# Patient Record
Sex: Female | Born: 1980 | Hispanic: Yes | Marital: Single | Smoking: Never smoker
Health system: Southern US, Community
[De-identification: ages and names within clinical notes are randomized; demographics above are authoritative.]

## PROBLEM LIST (undated history)

## (undated) ENCOUNTER — Inpatient Hospital Stay (HOSPITAL_COMMUNITY): Payer: Self-pay

## (undated) DIAGNOSIS — G43909 Migraine, unspecified, not intractable, without status migrainosus: Secondary | ICD-10-CM

## (undated) DIAGNOSIS — Z8759 Personal history of other complications of pregnancy, childbirth and the puerperium: Secondary | ICD-10-CM

## (undated) DIAGNOSIS — F329 Major depressive disorder, single episode, unspecified: Secondary | ICD-10-CM

## (undated) DIAGNOSIS — F419 Anxiety disorder, unspecified: Secondary | ICD-10-CM

## (undated) DIAGNOSIS — O24419 Gestational diabetes mellitus in pregnancy, unspecified control: Secondary | ICD-10-CM

## (undated) DIAGNOSIS — I10 Essential (primary) hypertension: Secondary | ICD-10-CM

## (undated) DIAGNOSIS — G40909 Epilepsy, unspecified, not intractable, without status epilepticus: Secondary | ICD-10-CM

## (undated) DIAGNOSIS — O139 Gestational [pregnancy-induced] hypertension without significant proteinuria, unspecified trimester: Secondary | ICD-10-CM

## (undated) DIAGNOSIS — O99350 Diseases of the nervous system complicating pregnancy, unspecified trimester: Secondary | ICD-10-CM

## (undated) DIAGNOSIS — S92902A Unspecified fracture of left foot, initial encounter for closed fracture: Secondary | ICD-10-CM

## (undated) DIAGNOSIS — F32A Depression, unspecified: Secondary | ICD-10-CM

## (undated) HISTORY — DX: Diseases of the nervous system complicating pregnancy, unspecified trimester: O99.350

## (undated) HISTORY — DX: Epilepsy, unspecified, not intractable, without status epilepticus: G40.909

## (undated) HISTORY — DX: Personal history of other complications of pregnancy, childbirth and the puerperium: Z87.59

## (undated) HISTORY — DX: Migraine, unspecified, not intractable, without status migrainosus: G43.909

## (undated) HISTORY — PX: TUBAL LIGATION: SHX77

## (undated) HISTORY — PX: NO PAST SURGERIES: SHX2092

---

## 2010-01-05 DIAGNOSIS — G40909 Epilepsy, unspecified, not intractable, without status epilepticus: Secondary | ICD-10-CM

## 2010-01-05 HISTORY — DX: Epilepsy, unspecified, not intractable, without status epilepticus: G40.909

## 2010-11-10 DIAGNOSIS — O24419 Gestational diabetes mellitus in pregnancy, unspecified control: Secondary | ICD-10-CM

## 2014-05-14 DIAGNOSIS — O139 Gestational [pregnancy-induced] hypertension without significant proteinuria, unspecified trimester: Secondary | ICD-10-CM

## 2014-05-14 DIAGNOSIS — O24419 Gestational diabetes mellitus in pregnancy, unspecified control: Secondary | ICD-10-CM

## 2015-12-12 LAB — OB RESULTS CONSOLE VARICELLA ZOSTER ANTIBODY, IGG: Varicella: NON-IMMUNE/NOT IMMUNE

## 2015-12-12 LAB — CULTURE, OB URINE: Urine Culture, OB: NO GROWTH

## 2015-12-12 LAB — OB RESULTS CONSOLE ABO/RH: RH TYPE: POSITIVE

## 2015-12-12 LAB — OB RESULTS CONSOLE HIV ANTIBODY (ROUTINE TESTING): HIV: NONREACTIVE

## 2015-12-12 LAB — GLUCOSE TOLERANCE, 1 HOUR (50G) W/O FASTING: GLUCOSE 1 HOUR: 87

## 2015-12-12 LAB — OB RESULTS CONSOLE HEPATITIS B SURFACE ANTIGEN: Hepatitis B Surface Ag: NEGATIVE

## 2015-12-12 LAB — OB RESULTS CONSOLE RPR: RPR: NONREACTIVE

## 2015-12-12 LAB — CYSTIC FIBROSIS DIAGNOSTIC STUDY: Interpretation-CFDNA:: NEGATIVE

## 2015-12-12 LAB — OB RESULTS CONSOLE GC/CHLAMYDIA
Chlamydia: NEGATIVE
Gonorrhea: NEGATIVE

## 2015-12-12 LAB — OB RESULTS CONSOLE HGB/HCT, BLOOD
HEMATOCRIT: 43 %
Hemoglobin: 14.2 g/dL

## 2015-12-12 LAB — OB RESULTS CONSOLE ANTIBODY SCREEN: Antibody Screen: NEGATIVE

## 2015-12-12 LAB — CYTOLOGY - PAP: Pap: NEGATIVE

## 2015-12-12 LAB — OB RESULTS CONSOLE PLATELET COUNT: Platelets: 323 10*3/uL

## 2015-12-12 LAB — OB RESULTS CONSOLE RUBELLA ANTIBODY, IGM: RUBELLA: IMMUNE

## 2015-12-27 ENCOUNTER — Other Ambulatory Visit (HOSPITAL_COMMUNITY): Payer: Self-pay | Admitting: Nurse Practitioner

## 2015-12-27 DIAGNOSIS — Z3682 Encounter for antenatal screening for nuchal translucency: Secondary | ICD-10-CM

## 2015-12-27 DIAGNOSIS — O09521 Supervision of elderly multigravida, first trimester: Secondary | ICD-10-CM

## 2015-12-27 DIAGNOSIS — Z3A13 13 weeks gestation of pregnancy: Secondary | ICD-10-CM

## 2015-12-31 ENCOUNTER — Encounter (HOSPITAL_COMMUNITY): Payer: Self-pay | Admitting: *Deleted

## 2016-01-01 ENCOUNTER — Encounter (HOSPITAL_COMMUNITY): Payer: Self-pay

## 2016-01-01 ENCOUNTER — Other Ambulatory Visit (HOSPITAL_COMMUNITY): Payer: Self-pay | Admitting: Nurse Practitioner

## 2016-01-01 ENCOUNTER — Ambulatory Visit (HOSPITAL_COMMUNITY)
Admission: RE | Admit: 2016-01-01 | Discharge: 2016-01-01 | Disposition: A | Discharge status: Home / Self Care | Payer: Self-pay | Source: Ambulatory Visit | Attending: Nurse Practitioner | Admitting: Nurse Practitioner

## 2016-01-01 DIAGNOSIS — O09522 Supervision of elderly multigravida, second trimester: Secondary | ICD-10-CM | POA: Insufficient documentation

## 2016-01-01 DIAGNOSIS — O09521 Supervision of elderly multigravida, first trimester: Secondary | ICD-10-CM

## 2016-01-01 DIAGNOSIS — O09511 Supervision of elderly primigravida, first trimester: Secondary | ICD-10-CM | POA: Insufficient documentation

## 2016-01-01 DIAGNOSIS — Z3A13 13 weeks gestation of pregnancy: Secondary | ICD-10-CM | POA: Insufficient documentation

## 2016-01-01 DIAGNOSIS — Z3682 Encounter for antenatal screening for nuchal translucency: Secondary | ICD-10-CM

## 2016-01-01 DIAGNOSIS — Z3A14 14 weeks gestation of pregnancy: Secondary | ICD-10-CM | POA: Insufficient documentation

## 2016-01-01 HISTORY — DX: Essential (primary) hypertension: I10

## 2016-01-01 HISTORY — DX: Gestational diabetes mellitus in pregnancy, unspecified control: O24.419

## 2016-01-01 NOTE — Progress Notes (Signed)
Appointment Date: 01/01/2016 DOB: 07/23/1980 Referring Provider: Felipe Hull, Margareta, NP Attending: Alpha GulaPaul Whitecar, MD  [name] Hull Hull was seen for genetic counseling because of a maternal age of 35 y.o..  [name] Hull Hull from [facility] Interpreter services provided Spanish/English translation.    In summary:  Discussed AMA and associated risk for fetal aneuploidy  Discussed options for screening  Quad screen - would like this drawn at appropriate gestation  NIPS - would consider if Quad and/or ultrasound was abnormal  Ultrasound - scheduled for 4 weeks  Discussed diagnostic testing options  Amniocentesis - declined  Reviewed family history concerns  Niece with hearing loss  Niece with congenital heart defect  Discussed carrier screening options - declined  CF  SMA  Hemoglobinopathies  She was counseled regarding maternal age and the association with risk for chromosome conditions due to nondisjunction with aging of the ova.   We reviewed chromosomes, nondisjunction, and the associated 1 in 2288 risk for fetal aneuploidy related to a maternal age of 35 y.o. at 9359w0d gestation.  She was counseled that the risk for aneuploidy decreases as gestational age increases, accounting for those pregnancies which spontaneously abort.  We specifically discussed Down syndrome (trisomy 4221), trisomies 913 and 3318, and sex chromosome aneuploidies (47,XXX and 47,XXY) including the common features and prognoses of each.   We reviewed available screening options including Quad screen, noninvasive prenatal screening (NIPS)/cell free DNA (cfDNA) screening, and detailed ultrasound.  She was counseled that screening tests are used to modify a patient's a priori risk for aneuploidy, typically based on age. This estimate provides a pregnancy specific risk assessment. We reviewed the benefits and limitations of each option. Specifically, we discussed the conditions for which each test screens, the  detection rates, and false positive rates of each. She was also counseled regarding diagnostic testing via amniocentesis. We reviewed the approximate 1 in 300-500 risk for complications from amniocentesis, including spontaneous pregnancy loss. We discussed the possible results that the tests might provide including: positive, negative, unanticipated, and no result. Finally, she was counseled regarding the cost of each option and potential out of pocket expenses.  After consideration of all the options, she elected to proceed with the Quad screen at the appropriate gestation.    She was too far along for a nuchal translucency to be measured by ultrasound today.  The report will be documented separately.  The patient would like to return for a detailed ultrasound at approximately [redacted] weeks gestation.  This appointment was scheduled today. She understands that screening tests cannot rule out all birth defects or genetic syndromes.   [name] Hull Hull was provided with written information regarding cystic fibrosis (CF), spinal muscular atrophy (SMA) and hemoglobinopathies including the carrier frequency, availability of carrier screening and prenatal diagnosis if indicated.  In addition, we discussed that CF and hemoglobinopathies are routinely screened for as part of the [place] newborn screening panel.  After further discussion, she declined screening for CF, SMA and hemoglobinopathies.  Both family histories were reviewed.  [name] reported that her sister has twin girls, one of whom has hearing loss.  She has no other health or learning concerns.  We discussed that hearing loss can have many different etiologies, some of which are genetic and others which are not.  Without knowing the etiology of her nieces hearing loss, a specific recurrence risk can not be determined for [name] Hull Hull.  The daughter of her brother was born with an isolated heart birth defect that did  not require surgery.  We  discussed that this is likely too far removed to increase the chance for a heart birth defect in her child above the general population chance.  The remainder of the family histories were reviewed and found to be noncontributory for birth defects, intellectual disability, and known genetic conditions. Without further information regarding the provided family history, an accurate genetic risk cannot be calculated. Further genetic counseling is warranted if more information is obtained.  [name] Hull Hull denied exposure to environmental toxins or chemical agents. She denied the use of alcohol, tobacco or street drugs. She denied significant viral illnesses during the course of her pregnancy. Her medical and surgical histories were noncontributory.   I counseled [name] Hull Hull regarding the above risks and available options.  The approximate face-to-face time with the genetic counselor was 45 minutes.  [name] Hull Hull, [name] Hull,  Certified Genetic Counselor

## 2016-01-02 ENCOUNTER — Other Ambulatory Visit (HOSPITAL_COMMUNITY): Payer: Self-pay | Admitting: *Deleted

## 2016-01-02 DIAGNOSIS — O09522 Supervision of elderly multigravida, second trimester: Secondary | ICD-10-CM

## 2016-01-30 ENCOUNTER — Ambulatory Visit (HOSPITAL_COMMUNITY)
Admission: RE | Admit: 2016-01-30 | Discharge: 2016-01-30 | Disposition: A | Discharge status: Home / Self Care | Payer: Self-pay | Source: Ambulatory Visit | Attending: Obstetrics & Gynecology | Admitting: Obstetrics & Gynecology

## 2016-01-30 ENCOUNTER — Encounter (HOSPITAL_COMMUNITY): Payer: Self-pay

## 2016-01-30 ENCOUNTER — Ambulatory Visit (HOSPITAL_COMMUNITY): Payer: Self-pay

## 2016-01-30 ENCOUNTER — Other Ambulatory Visit (HOSPITAL_COMMUNITY): Payer: Self-pay | Admitting: Maternal and Fetal Medicine

## 2016-01-30 DIAGNOSIS — O281 Abnormal biochemical finding on antenatal screening of mother: Secondary | ICD-10-CM

## 2016-01-30 DIAGNOSIS — O09522 Supervision of elderly multigravida, second trimester: Secondary | ICD-10-CM

## 2016-01-30 DIAGNOSIS — Z8632 Personal history of gestational diabetes: Secondary | ICD-10-CM

## 2016-01-30 DIAGNOSIS — Z3A18 18 weeks gestation of pregnancy: Secondary | ICD-10-CM

## 2016-01-30 DIAGNOSIS — O280 Abnormal hematological finding on antenatal screening of mother: Secondary | ICD-10-CM | POA: Insufficient documentation

## 2016-01-30 DIAGNOSIS — O09299 Supervision of pregnancy with other poor reproductive or obstetric history, unspecified trimester: Secondary | ICD-10-CM

## 2016-01-30 NOTE — Progress Notes (Signed)
Genetic Counseling  High-Risk Gestation Note  Appointment Date:  01/30/2016 Referred By: Tereso NewcomerAnyanwu, Ugonna A, MD Date of Birth:  02/22/1980   Pregnancy History: [id number] Estimated Date of Delivery: 07/02/16 Estimated Gestational Age: 5765w0d Attending: Particia NearingMartha Decker, MD    Ms. [name] SickleDiana Zavala Hull was seen for genetic counseling regarding a maternal age of 36 y.o. and an increased risk for Down syndrome based on Quad screen [facility]. [name] Hull was previously seen for genetic counseling in our office regarding advanced maternal age on 01/01/16. [employer] Spanish/English interpreter, Marchelle FolksAmanda, provided interpretation for today's visit.    In summary:  Discussed AMA and associated risk for fetal aneuploidy  Reviewed results of screening  Down syndrome risk increased from age risk (1 in 70203) to 1 in 21184   Trisomy 18 risk decreased from age risk  Discussed options for additional screening  NIPS- elected to pursue Panorama today   Ultrasound- performed today, within normal limits  Discussed diagnostic testing options  Amniocentesis- declined   We reviewed [name] Hull's maternal serum Quad screen result and the associated increase in risk for fetal Down syndrome (1 in 203 to 1 in 184).  She was counseled regarding other explanations for a screen positive result including normal variation and differences in maternal metabolism.  In addition, we reviewed the screen adjusted reduction in risks for trisomy 18 and ONTDs.  She understands that Quad screening provides a pregnancy specific risk for Down syndrome, but is not considered to be diagnostic.    We reviewed other available screening options including noninvasive prenatal screening (NIPS)/prenatal cell free DNA testing and detailed ultrasound.  We reviewed the methodology of NIPS, the conditions for which it assesses, the detection rates, and false positive rates. She understands  that it is highly sensitive and specific but is not diagnostic.  In addition, we discussed that ~50-80% of fetuses with Down syndrome and up to 90-95% of fetuses with trisomy 18/13, when well visualized, have detectable anomalies or soft markers by detailed ultrasound (~18+ weeks gestation).   Detailed ultrasound was performed today. Visualized fetal anatomy was within normal range. Complete ultrasound results reported under separate cover.   We reviewed the diagnostic testing option via amniocentesis.  We reviewed the approximate 1 in 300-500 risk for complications, including spontaneous pregnancy loss.  We discussed the possible results that the tests might provide including: positive, negative, unanticipated, and no result. Finally, they were counseled regarding the cost of each option and potential out of pocket expenses. After consideration of all the options, she elected to proceed with cell free DNA testing (Panorama). Those results will be available in 8-10 days.  She declined amniocentesis.   See genetic counseling note from 01/01/2016 for detailed family history and pregnancy history discussion.  I counseled [name] regarding the above risks and available options.  The approximate face-to-face time with the genetic counselor was 25 minutes.    Quinn PlowmanKaren Stanislav Gervase, MS,  Certified Genetic Counselor 01/30/2016

## 2016-02-07 ENCOUNTER — Other Ambulatory Visit: Payer: Self-pay

## 2016-02-07 ENCOUNTER — Telehealth (HOSPITAL_COMMUNITY): Payer: Self-pay | Admitting: MS"

## 2016-02-07 NOTE — Telephone Encounter (Signed)
Called [name] Hull to discuss her prenatal cell free DNA test results via telephonic Spanish/English interpreter [phone].  Ms. [name] SickleDiana Zavala Hull had Pan[facility]ama testing through [facility].  Testing was offered because of advanced maternal age and abnormal Quad screen result.   The patient was identified by name and DOB.  We reviewed that these are within normal limits, showing a less than 1 in 10,000 risk for trisomies 21, 18 and 13, and monosomy X (Turner syndrome).  In addition, the risk for triploidy and sex chromosome trisomies (47,XXX and 47,XXY) was also low risk.  We reviewed that this testing identifies > 99% of pregnancies with trisomy 4121, trisomy 1413, sex chromosome trisomies (47,XXX and 47,XXY), and triploidy. The detection rate for trisomy 18 is 96%.  The detection rate for monosomy X is ~92%.  The false positive rate is <0.1% for all conditions. Testing was also consistent with female fetal sex.  The patient did wish to know fetal sex.  She understands that this testing does not identify all genetic conditions.  All questions were answered to her satisfaction, she was encouraged to call with additional questions or concerns.  Quinn PlowmanKaren Osaze Hubbert, MS Patent attorneyCertified Genetic Counselor

## 2016-02-11 ENCOUNTER — Other Ambulatory Visit (HOSPITAL_COMMUNITY): Payer: Self-pay

## 2016-02-14 ENCOUNTER — Encounter: Payer: Self-pay | Admitting: *Deleted

## 2016-02-17 ENCOUNTER — Encounter: Payer: Self-pay | Admitting: Obstetrics and Gynecology

## 2016-02-17 ENCOUNTER — Ambulatory Visit (INDEPENDENT_AMBULATORY_CARE_PROVIDER_SITE_OTHER): Payer: Self-pay | Admitting: Obstetrics and Gynecology

## 2016-02-17 VITALS — BP 121/52 | HR 97 | Ht 63.0 in | Wt 183.2 lb

## 2016-02-17 DIAGNOSIS — O09522 Supervision of elderly multigravida, second trimester: Secondary | ICD-10-CM

## 2016-02-17 DIAGNOSIS — O09529 Supervision of elderly multigravida, unspecified trimester: Secondary | ICD-10-CM | POA: Insufficient documentation

## 2016-02-17 DIAGNOSIS — Z8759 Personal history of other complications of pregnancy, childbirth and the puerperium: Secondary | ICD-10-CM | POA: Insufficient documentation

## 2016-02-17 HISTORY — DX: Personal history of other complications of pregnancy, childbirth and the puerperium: Z87.59

## 2016-02-17 LAB — COMPREHENSIVE METABOLIC PANEL
ALK PHOS: 33 U/L (ref 33–115)
ALT: 11 U/L (ref 6–29)
AST: 13 U/L (ref 10–30)
Albumin: 3.4 g/dL — ABNORMAL LOW (ref 3.6–5.1)
BILIRUBIN TOTAL: 0.3 mg/dL (ref 0.2–1.2)
BUN: 7 mg/dL (ref 7–25)
CALCIUM: 9.2 mg/dL (ref 8.6–10.2)
CO2: 24 mmol/L (ref 20–31)
Chloride: 104 mmol/L (ref 98–110)
Creat: 0.49 mg/dL — ABNORMAL LOW (ref 0.50–1.10)
GLUCOSE: 102 mg/dL — AB (ref 65–99)
Potassium: 4 mmol/L (ref 3.5–5.3)
Sodium: 136 mmol/L (ref 135–146)
Total Protein: 6.3 g/dL (ref 6.1–8.1)

## 2016-02-17 LAB — CBC
HCT: 38.4 % (ref 35.0–45.0)
Hemoglobin: 12.8 g/dL (ref 11.7–15.5)
MCH: 32.3 pg (ref 27.0–33.0)
MCHC: 33.3 g/dL (ref 32.0–36.0)
MCV: 97 fL (ref 80.0–100.0)
MPV: 10.2 fL (ref 7.5–12.5)
PLATELETS: 328 10*3/uL (ref 140–400)
RBC: 3.96 MIL/uL (ref 3.80–5.10)
RDW: 13.7 % (ref 11.0–15.0)
WBC: 14.1 10*3/uL — ABNORMAL HIGH (ref 3.8–10.8)

## 2016-02-17 MED ORDER — ASPIRIN 81 MG PO TABS: 81.0000 mg | ORAL_TABLET | Freq: Every day | ORAL | 6 refills | Status: DC

## 2016-02-17 NOTE — Progress Notes (Signed)
Spanish Interpreter Laveda NormanBlanca Linder  New ob packet given

## 2016-02-17 NOTE — Progress Notes (Signed)
   PRENATAL VISIT NOTE  Subjective:  [name] Hull is a 36 y.o. G3P2002 at 22w4dbeing seen today for intial prenatal care after [facility] department for history of eclampsia.  She is currently monitored for the following issues for this high-risk pregnancy and has Advanced maternal age in multigravida, second trimester; Abnormal maternal serum screening test; and History of eclampsia on her problem list.  Patient reports She does report some occasional swelling in the hands and face when she waks up in the morning and in the feet at night. The LE swelling resoves overnight and the upper extremity swelling resoves rapidly during the day. .  Contractions: Not present. Vag. Bleeding: None.  Movement: Present. Denies leaking of fluid.   The following portions of the patient's history were reviewed and updated as appropriate: allergies, current medications, past family history, past medical history, past social history, past surgical history and problem list. Problem list updated.  Objective:   Vitals:   02/17/16 0929 02/17/16 0931  BP: (!) 121/52   Pulse: 97   Weight: 183 lb 3.2 oz (83.1 kg)   Height:  '5\' 3"'$  (1.6 m)    Fetal Status: Fetal Heart Rate (bpm): 147   Movement: Present     General:  Alert, oriented and cooperative. Patient is in no acute distress.  Skin: Skin is warm and dry. No rash noted.   Cardiovascular: Normal heart rate noted  Respiratory: Normal respiratory effort, no problems with respiration noted  Abdomen: Soft, gravid, appropriate for gestational age. Pain/Pressure: Absent     Pelvic:  Cervical exam deferred        Extremities: Normal range of motion.  Edema: None  Mental Status: Normal mood and affect. Normal behavior. Normal judgment and thought content.   Assessment and Plan:  Pregnancy: G3P2002 at 248w4d1. History of eclampsia - Comp Met (CMET) - CBC - Protein / Creatinine Ratio, Urine -continue baby ASA  2. High Risk  pregnancy NIPS normal No other concerns Up-to date on testing Will need growth in the third trimester.  Preterm labor symptoms and general obstetric precautions including but not limited to vaginal bleeding, contractions, leaking of fluid and fetal movement were reviewed in detail with the patient. Please refer to After Visit Summary for other counseling recommendations.  Return for HROB.   NiWaldemar DickensMD

## 2016-02-17 NOTE — Patient Instructions (Signed)
Crecimiento del beb durante el embarazo (How a Baby Grows During Pregnancy) El embarazo comienza cuando el semen de un hombre ingresa al vulo de una mujer (fecundacin). Esto ocurre en una de las trompas de Falopio que conecta los ovarios con el tero. Al vulo fecundado se lo denomina embrin hasta que alcanza las 10semanas. A partir de las 10semanas y hasta el momento del parto, se llama feto. El vulo fecundado se desplaza por la trompa de Falopio hasta llegar al tero y luego se implanta en el endometrio y empieza crecer. El feto en crecimiento recibe oxgeno y nutrientes a travs del torrente sanguneo de la embarazada y de los tejidos que se forman (placenta) para la sustentacin fetal. La placenta es el sistema de sustentacin de la vida del feto, proporciona la nutricin y elimina los desechos. Informarse tanto como pueda sobre el embarazo y la forma en que se desarrolla el beb puede ayudarla a disfrutar de la experiencia, y, adems, a que se d cuenta de cundo puede haber un problema y cundo hacer preguntas. CUNTO DURA UN EMBARAZO NORMAL? Generalmente, el embarazo dura 280das, o unas 40semanas. Se divide tres trimestres:  Primer trimestre: desde la semana0 a la13.  Segundo trimestre: desde la semana14 a la27.  Tercer trimestre: desde la semana28 a la40. El da que se considera que el beb est listo para nacer (a trmino) es la fecha prevista de parto. CMO SE DESARROLLA EL BEB MES A MES? Primer mes  El vulo fecundado se implanta dentro del tero.  Algunas clulas formarn la placenta, y otras formarn el feto.  Empiezan a desarrollarse los brazos, las piernas, la mdula espinal, los pulmones y el corazn.  Al final del primer mes, el corazn comienza a latir. Segundo mes  Se forman los huesos, el odo interno, los prpados, las manos y los pies.  Se desarrollan los genitales.  Al final de las 8semanas, todos los rganos importantes estn en  desarrollo. Tercer mes  Se estn formando todos los rganos internos.  Se forman los dientes debajo de las encas.  Empiezan a crecer los huesos y los msculos. La columna vertebral tiene movimiento de flexin.  La piel es transparente.  Empiezan a formarse las uas de las manos y de los pies.  Los brazos y las piernas siguen alargndose, y se desarrollan las manos y los pies.  El feto mide aproximadamente 3pulgadas (7,6cm) de largo. Cuarto mes  La placenta est totalmente formada.  Se han formado los rganos sexuales externos, el cuello, las orejas, las cejas, los prpados y las uas de las manos.  El feto puede or, tragar y mover los brazos y las piernas.  Los riones empiezan a producir orina.  La piel est recubierta por una sustancia sebcea blanca (unto sebceo) y un vello muy fino (lanugo). Quinto mes  El feto se mueve ms y es posible sentirlo por primera vez (da pataditas).  Empieza a dormir y despertarse, y tal vez comience a chuparse el dedo.  Crecen las uas en las puntas de los dedos.  Funciona el rgano del sistema digestivo que produce bilis (vescula biliar) y ayuda a digerir los nutrientes.  Si el beb es nia, tiene vulos en los ovarios. Si el beb es varn, los testculos empiezan a descender hasta el escroto. Sexto mes  Se han formado los pulmones, pero el feto an no puede respirar.  Los ojos se abren. El cerebro sigue desarrollndose.  El beb tiene huellas en los dedos de las manos y   los pies. El cabello del beb se vuelve ms abundante.  A fines del segundo trimestre, el feto mide aproximadamente 9pulgadas (22,9cm) de largo. Sptimo mes  El feto patea y se estira.  Los ojos se han desarrollado lo suficiente como para percibir los cambios de luz.  Las manos pueden hacer movimientos de prensin.  El feto responde a los ruidos. Octavo mes  Todos los rganos, as como los sistemas y aparatos del organismo, estn totalmente  desarrollados y en funcionamiento.  Los huesos se solidifican, y se desarrollan los botones gustativos. Es posible que el feto tenga hipo.  Determinadas regiones del cerebro an se estn desarrollando. El crneo sigue siendo blando. Noveno mes  El feto aumenta aproximadamente libra (230g) cada semana.  Los pulmones estn totalmente desarrollados.  Se desarrollan los hbitos de sueo.  Generalmente, el feto se acomoda con la cabeza hacia abajo (presentacin ceflica de vrtice) en el tero para prepararse para el parto. En cambio, si los glteos se acomodan en esta posicin, el beb est de nalgas.  El feto pesa entre 6 y 9libras (2,72 y 4,08kg) y mide entre 19 y 20pulgadas (48,26 a 50,8cm) de largo. QU PUEDO HACER PARA QUE EL EMBARAZO SEA SANO Y PARA AYUDAR AL BEB A DESARROLLARSE? Comida y bebida  Consuma una dieta saludable.  Hable con el mdico para asegurarse de que est recibiendo los nutrientes que usted y el beb necesitan.  Visite www.choosemyplate.gov para obtener ms informacin sobre cmo crear una dieta saludable.  El mdico le aconsejar cul es la cantidad saludable de peso a aumentar durante el embarazo, por lo general, entre 25 y 35libras (11 y 16kg). Puede ser necesario que:  Aumente ms si tena bajo peso antes de quedar embarazada o si est embarazada de ms de un beb.  Aumente menos si tena sobrepeso u obesidad cuando qued embarazada. Medicamentos y vitaminas  Tome las vitaminas prenatales como se lo haya indicado el mdico, entre ellas, cido flico, hierro, calcio y vitaminaD, que son importantes para el desarrollo saludable.  Tome los medicamentos solamente como se lo haya indicado el mdico. Lea las etiquetas y consulte al farmacutico o al mdico si puede tomar medicamentos de venta libre, suplementos y medicamentos recetados durante el embarazo. Actividades  Haga actividad fsica como se lo haya aconsejado el mdico. Pdale al mdico que  le recomiende actividades que sean seguras para usted, como caminar o practicar natacin.  No participe en deportes extremos ni extenuantes. Estilo de vida  No beba alcohol.  No consuma ningn producto que contenga tabaco, lo que incluye cigarrillos, tabaco de mascar o cigarrillos electrnicos. Si necesita ayuda para dejar de fumar, consulte al mdico.  No consuma drogas. Seguridad  No se exponga al mercurio, al plomo ni a otros metales pesados. Pregntele al mdico acerca de las fuentes comunes de estos metales pesados.  Evite la infeccin por listeria durante el embarazo. Tome las siguientes precauciones:  No coma quesos blandos ni fiambres.  No coma perros calientes, salvo que hayan sido calentados al punto de emitir vapor, por ejemplo, en el microondas.  No tome leche no pasteurizada.  Evite la infeccin por toxoplasmosis durante el embarazo. Tome las siguientes precauciones:  No cambie la arena sanitaria del gato, si tiene uno. Pdale a otra persona que lo haga por usted.  Use guantes de jardinera mientras trabaja en el jardn. Instrucciones generales  Concurra a todas las visitas de control como se lo haya indicado el mdico. Esto es importante. Estas incluyen las visitas   de cuidado prenatal y las pruebas de deteccin.  Mantenga las enfermedades crnicas bajo control. Trabaje en estrecha colaboracin con el mdico para mantener las enfermedades bajo control, por ejemplo, la diabetes. CMO S SI EL BEB SE EST DESARROLLANDO BIEN? En cada visita de cuidado prenatal, el mdico har varios estudios diferentes para controlar su estado de salud y hacer un seguimiento del desarrollo del beb. Estos incluyen los siguientes:  Altura uterina.  El mdico le medir el vientre en crecimiento desde la parte superior a la inferior con una cinta mtrica.  Adems, le palpar el vientre para determinar la posicin del beb.  Latido cardaco.  Una ecografa realizada en el primer  trimestre puede confirmar el embarazo y mostrar un latido cardaco, dependiendo del tiempo de gestacin.  El mdico controlar la frecuencia cardaca del beb en cada visita de cuidado prenatal.  A medida que se aproxima la fecha de parto, tal vez se hagan controles habituales de la frecuencia cardaca para garantizar que no haya sufrimiento fetal.  Ecografa del segundo trimestre.  Esta ecografa controla el desarrollo del beb y tambin indica su sexo. QU DEBO HACER SI TENGO ALGUNA INQUIETUD RESPECTO DEL DESARROLLO DEL BEB? Hable siempre con el mdico si tiene alguna inquietud. Esta informacin no tiene como fin reemplazar el consejo del mdico. Asegrese de hacerle al mdico cualquier pregunta que tenga. Document Released: 06/10/2007 Document Revised: 04/15/2015 Document Reviewed: 05/31/2013 Elsevier Interactive Patient Education  2017 Elsevier Inc.  

## 2016-02-18 LAB — PROTEIN / CREATININE RATIO, URINE
Creatinine, Urine: 149 mg/dL (ref 20–320)
Protein Creatinine Ratio: 87 mg/g creat (ref 21–161)
Total Protein, Urine: 13 mg/dL (ref 5–24)

## 2016-03-18 ENCOUNTER — Ambulatory Visit (INDEPENDENT_AMBULATORY_CARE_PROVIDER_SITE_OTHER): Payer: Self-pay | Admitting: Obstetrics & Gynecology

## 2016-03-18 VITALS — BP 118/51 | HR 90 | Wt 189.3 lb

## 2016-03-18 DIAGNOSIS — O09529 Supervision of elderly multigravida, unspecified trimester: Secondary | ICD-10-CM

## 2016-03-18 DIAGNOSIS — O09522 Supervision of elderly multigravida, second trimester: Secondary | ICD-10-CM

## 2016-03-18 DIAGNOSIS — Z8759 Personal history of other complications of pregnancy, childbirth and the puerperium: Secondary | ICD-10-CM

## 2016-03-18 NOTE — Progress Notes (Signed)
   PRENATAL VISIT NOTE  Subjective:  [name] Hull is a 36 y.o. G3P2002 at 4050w6d being seen today for ongoing prenatal care.  She is currently monitored for the following issues for this high-risk pregnancy and has Advanced maternal age in multigravida, second trimester; Abnormal maternal serum screening test; History of eclampsia; and Encounter for supervision of high risk multigravida of advanced maternal age, antepartum on her problem list. Patient is Spanish-speaking only, Spanish interpreter present for this encounter.  Patient reports occasional left sided musculoskeletal pain. No associated GI or GU symptoms.  Contractions: Not present. Vag. Bleeding: None.  Movement: Present. Denies leaking of fluid.   The following portions of the patient's history were reviewed and updated as appropriate: allergies, current medications, past family history, past medical history, past social history, past surgical history and problem list. Problem list updated.  Objective:   Vitals:   03/18/16 0806  BP: (!) 118/51  Pulse: 90  Weight: 189 lb 4.8 oz (85.9 kg)    Fetal Status: Fetal Heart Rate (bpm): 150 Fundal Height: 26 cm Movement: Present     General:  Alert, oriented and cooperative. Patient is in no acute distress.  Skin: Skin is warm and dry. No rash noted.   Cardiovascular: Normal heart rate noted  Respiratory: Normal respiratory effort, no problems with respiration noted  Abdomen: Soft, gravid, appropriate for gestational age. Pain/Pressure: Absent     Pelvic:  Cervical exam deferred        Extremities: Normal range of motion.  Edema: None  Mental Status: Normal mood and affect. Normal behavior. Normal judgment and thought content.   Assessment and Plan:  Pregnancy: G3P2002 at 7150w6d  1. History of eclampsia BP stable. Continue ASA.  2. Encounter for supervision of high risk multigravida of advanced maternal age, antepartum Reassured about aches/pain of pregnancy,  precautions advised. Preterm labor symptoms and general obstetric precautions including but not limited to vaginal bleeding, contractions, leaking of fluid and fetal movement were reviewed in detail with the patient. Please refer to After Visit Summary for other counseling recommendations.  Return in about 4 weeks (around 04/15/2016) for 2 hr GTT, 3rd trimester labs, TDap, OB Visit (HOB).   Tereso NewcomerUgonna A Gaynell Eggleton, MD

## 2016-03-18 NOTE — Patient Instructions (Signed)
Regrese a la clinica cuando tenga su cita. Si tiene problemas o preguntas, llama a la clinica o vaya a la sala de [place].  Vacuna Tdap (contra la difteria, el ttanos y [place]): Lo que debe saber (Tdap Vaccine [Tetanus, Diphtheria, and Pertussis]: What You Need to Know) 1. Por qu vacunarse? El ttanos, la difteria y la tosferina son enfermedades muy graves. La vacuna Tdap nos puede proteger de estas enfermedades. Adems, la vacuna Tdap que se aplica a las Chemical engineer a los bebs recin nacidos contra la tosferina. En la actualidad, el [place] (trismo) es una enfermedad poco frecuente en los [place]. Provoca la contraccin y el endurecimiento dolorosos de los msculos, por lo general, de todo el cuerpo.  Puede causar el endurecimiento de los msculos de la cabeza y el cuello, de modo que impide abrir la boca, tragar y en algunos casos, Ambulance person. El ttanos causa la muerte de aproximadamente 1de cada 10personas que contraen la infeccin, incluso despus de que reciben la mejor atencin mdica. La DIFTERIA tambin es poco frecuente en los [place] [employer]. Puede causar la formacin de una membrana gruesa en la parte posterior de la garganta.  Esto tiene como consecuencia problemas respiratorios, insuficiencia cardaca, parlisis y [place]. La TOSFERINA (tos convulsa) provoca episodios de tos intensa que pueden dificultar la respiracin y provocar vmitos y trastornos del sueo.  Tambin puede causar prdida de peso, incontinencia y fractura de [place]. Dos de cada 100 adolescentes y 5 de cada 100 adultos con tosferina deben ser hospitalizados o tienen complicaciones, que podran incluir neumona y [place]. Estas enfermedades son provocadas por bacterias. La difteria y la tosferina se contagian de Ardelia Mems persona a otra a travs de las secreciones de la tos o el estornudo. El ttanos ingresa al organismo a travs de cortes, rasguos o  heridas. Antes de las vacunas, en los [place] se informaban 200000 casos de difteria, 200000 casos de tosferina y cientos de casos de ttanos cada ao. Desde el inicio de la vacunacin, los informes de casos de ttanos y difteria han disminuido alrededor del 99%, y de tosferina, alrededor del 80%. 2. Edward Jolly Tdap La vacuna Tdap protege a adolescentes y adultos contra el ttanos, la difteria y la tosferina. Una dosis de Tdap se administra a los 37 o 12 aos. Las [place] [employer] no recibieron la vacuna Tdap a esa edad deben recibirla tan pronto como sea posible. Es muy importante que los mdicos y todos aquellos que tengan contacto cercano con bebs menores de 84meses reciban la vacuna Tdap. Las mujeres deben recibir una dosis de Tdap en cada Media planner, para proteger al recin nacido de la tosferina. Los nios tienen mayor riesgo de complicaciones graves y potencialmente mortales debido a la tosferina. Otra vacuna llamada Td protege contra el ttanos y la difteria, pero no contra la tosferina. Todos deben recibir una dosis de refuerzo de Td cada 10 aos. La Tdap puede aplicarse como uno de estos refuerzos si nunca antes recibi esta vacuna. Tambin se puede aplicar despus de un corte o quemadura grave para prevenir la infeccin por ttanos. El mdico o la persona que le aplique la vacuna puede darle ms informacin al [employer]. La Tdap puede administrarse de manera segura simultneamente con otras vacunas. 3. Algunas personas no deben recibir la [employer] persona que alguna vez tuvo una reaccin alrgica potencialmente mortal a Ardelia Mems dosis previa de cualquier vacuna contra el ttanos, la difteria o la tosferina, O que  tenga Obie Dredge grave a cualquiera de los componentes de esta vacuna, no debe recibir la vacuna Tdap. Informe a la persona que le aplica la vacuna si tiene cualquier alergia grave.  Una persona que estuvo en estado de coma o sufri mltiples convulsiones en el trmino de los 7das  despus de recibir una dosis de DTP o DTaP, o una dosis previa de Tdap, no debe recibir la vacuna Tdap, salvo que se haya encontrado otra causa que no fuera la vacuna. An puede recibir la Td.  Consulte con su mdico si:  tiene convulsiones u otro problema del sistema nervioso,  tuvo hinchazn o dolor intenso despus de cualquier vacuna contra la difteria o el ttanos,  alguna vez ha sufrido el sndrome de [place],  no se siente Pharmacologist en que se ha programado la vacuna. 4. Riesgos Con cualquier medicamento, incluyendo las vacunas, existe la posibilidad de que aparezcan efectos secundarios. Suelen ser leves y desaparecen por s solos. Tambin son posibles las reacciones graves, pero en raras ocasiones. La [employer] de las personas a las que se les aplica la vacuna Tdap no tienen ningn problema. Problemas leves despus de la vacuna Tdap (No interfirieron en otras actividades)  Dolor en el lugar donde se aplic la vacuna (alrededor de 3 de cada 4 adolescentes o 2 de cada 3 adultos).  Enrojecimiento o hinchazn en el lugar donde se aplic la vacuna (1 de cada 5 personas).  Fiebre leve de al menos 100,37F (38C) (hasta alrededor de 1 cada 25 adolescentes o 1 de cada 100 adultos).  Dolor de cabeza (alrededor de 3 o 4 de cada 10 personas).  Cansancio (alrededor de 1 de cada 3 o 4 personas).  Nuseas, vmitos, diarrea, dolor de estmago (hasta 1 de cada 4 adolescentes o 1 de cada 10 adultos).  Escalofros, dolores articulares (alrededor de 1de cada 10personas).  Dolores corporales (alrededor de 1de cada 3 o 4personas).  Erupcin cutnea, inflamacin de los ganglios (poco frecuente). Problemas moderados despus de recibir la vacuna Tdap (Interfirieron en otras actividades, pero no requirieron atencin mdica)  Management consultant donde se aplic la vacuna (hasta 1de cada 5 o 6).  Enrojecimiento o inflamacin en el lugar donde se aplic la vacuna (hasta alrededor de 1 de  cada 16adolescentes o 1 de cada 12adultos).  Fiebre de ms de 102F (38,8C) (alrededor de 1 de cada 100 adolescentes o 1 de cada 250 adultos).  Dolor de cabeza (alrededor de 1de cada 7adolescentes o 1de cada 10adultos).  Nuseas, vmitos, diarrea, dolor de estmago (hasta 1 o 3 de cada 100 personas).  Hinchazn de todo el brazo en el que se aplic la vacuna (hasta alrededor de 1de cada 500personas). Problemas graves despus de la vacuna Tdap (Impidieron Optometrist las actividades habituales; requirieron atencin mdica)  Inflamacin, dolor intenso, sangrado y enrojecimiento en el brazo en que se aplic la vacuna (poco frecuente). Problemas que podran ocurrir despus de cualquier vacuna:  Las personas a veces se desmayan despus de un procedimiento mdico, incluida la vacunacin. Si permanece sentado o recostado durante 15 minutos puede ayudar a [employer] y las lesiones causadas por las cadas. Informe al mdico si se siente mareado, tiene cambios en la visin o zumbidos en los odos.  Algunas personas sienten un dolor intenso en el hombro y tienen dificultad para mover el brazo donde se coloc la vacuna. Esto sucede con muy poca frecuencia.  Cualquier medicamento puede causar una reaccin alrgica grave. Dichas reacciones son Orlene Erm  poco frecuentes con una vacuna (se calcula que menos de 1en un milln de dosis) y se producen de unos minutos a unas horas despus de Arts development officerla administracin. Al igual que con cualquier Automatic Datamedicamento, existe una probabilidad muy remota de que una vacuna cause una lesin grave o la [place]. Se controla permanentemente la seguridad de las vacunas. Para obtener ms informacin, visite: http://floyd.org/www.cdc.gov/vaccinesafety/. 5. Qu pasa si hay un problema grave? A qu signos debo estar atento?  Observe todo lo que le preocupe, como signos de una reaccin alrgica grave, fiebre muy alta o comportamiento fuera de lo normal.  Los signos de una reaccin alrgica grave  pueden incluir ronchas, hinchazn de la cara y la garganta, dificultad para respirar, latidos cardacos acelerados, mareos y debilidad. Generalmente, estos comenzaran entre unos pocos minutos y algunas horas despus de la vacunacin. Qu debo hacer?  Si usted piensa que se trata de una reaccin alrgica grave o de otra emergencia que no puede esperar, llame al 911 o lleve a la persona al hospital ms cercano. Sino, llame a su mdico.  Despus, la reaccin debe informarse al [address] de Informacin sobre Efectos Adversos de las [place]acun[place] (Vaccine Adverse Event Reporting System, VAERS). Su mdico puede presentar este informe, o puede hacerlo usted mismo a travs del sitio web de VAERS, en www.vaers.LAgents.nohhs.gov, o llamando al [phone]. VAERS no brinda recomendaciones mdicas. 6. SunTrustPrograma Nacional de Compensacin de Daos por American Electric PowerVacunas [place] Shawn[place]sterPrograma Nacional de Compensacin de Daos por Administrator, artsVacunas (National Vaccine Injury Compensation Program, VICP) es un programa federal que fue creado para Patent examinercompensar a las personas que puedan haber sufrido daos al recibir ciertas vacunas. Aquellas personas que consideren que han sufrido un dao como consecuencia de una vacuna y [place]urasquieran saber ms acerca del programa y de cmo presentar Roslynn Ambleuna denuncia, pueden llamar al [phone] o visitar su sitio web en SpiritualWord.atwww.hrsa.gov/vaccinecompensation. Hay un lmite de tiempo para presentar un reclamo de compensacin. 7. Cmo puedo obtener ms informacin?  Consulte a su mdico. Este puede darle el prospecto de la vacuna o recomendarle otras fuentes de informacin.  Comunquese con el servicio de salud de su localidad o [address] estado.  Comunquese con los Centros para Air traffic controllerel Control y la Prevencin de Child psychotherapistnfermedades (Centers for Disease Control and Prevention , CDC).  Llame al [phone] (1-800-CDC-INFO), o  visite el sitio web Hartford Financialde los CDC en PicCapture.uywww.cdc.gov/vaccines. Declaracin de informacin sobre la vacuna contra la difteria, el ttanos y  la tosferina (Tdap) de los CDC (24/02/15) Esta informacin no tiene Theme park managercomo fin reemplazar el consejo del mdico. Asegrese de hacerle al mdico cualquier pregunta que tenga. Document Released: 12/09/2011 Document Revised: 01/12/2014 Document Reviewed: 04/05/2013 Elsevier Interactive Patient Education  2017 ArvinMeritorElsevier Inc.

## 2016-03-24 ENCOUNTER — Encounter (HOSPITAL_COMMUNITY): Payer: Self-pay

## 2016-03-24 ENCOUNTER — Inpatient Hospital Stay (HOSPITAL_COMMUNITY)
Admission: AD | Admit: 2016-03-24 | Discharge: 2016-03-24 | Disposition: A | Discharge status: Home / Self Care | Payer: Self-pay | Source: Ambulatory Visit | Attending: Family Medicine | Admitting: Family Medicine

## 2016-03-24 DIAGNOSIS — R519 Headache, unspecified: Secondary | ICD-10-CM

## 2016-03-24 DIAGNOSIS — Z3A25 25 weeks gestation of pregnancy: Secondary | ICD-10-CM | POA: Insufficient documentation

## 2016-03-24 DIAGNOSIS — R51 Headache: Secondary | ICD-10-CM | POA: Insufficient documentation

## 2016-03-24 DIAGNOSIS — O26892 Other specified pregnancy related conditions, second trimester: Secondary | ICD-10-CM | POA: Insufficient documentation

## 2016-03-24 DIAGNOSIS — R102 Pelvic and perineal pain: Secondary | ICD-10-CM | POA: Insufficient documentation

## 2016-03-24 DIAGNOSIS — N949 Unspecified condition associated with female genital organs and menstrual cycle: Secondary | ICD-10-CM

## 2016-03-24 DIAGNOSIS — O9989 Other specified diseases and conditions complicating pregnancy, childbirth and the puerperium: Secondary | ICD-10-CM

## 2016-03-24 LAB — COMPREHENSIVE METABOLIC PANEL
ALBUMIN: 2.9 g/dL — AB (ref 3.5–5.0)
ALT: 15 U/L (ref 14–54)
AST: 17 U/L (ref 15–41)
Alkaline Phosphatase: 38 U/L (ref 38–126)
Anion gap: 7 (ref 5–15)
BILIRUBIN TOTAL: 0.3 mg/dL (ref 0.3–1.2)
BUN: 5 mg/dL — ABNORMAL LOW (ref 6–20)
CALCIUM: 9.3 mg/dL (ref 8.9–10.3)
CO2: 23 mmol/L (ref 22–32)
Chloride: 108 mmol/L (ref 101–111)
Creatinine, Ser: 0.54 mg/dL (ref 0.44–1.00)
GFR calc Af Amer: >60 mL/min (ref >60)
GFR calc non Af Amer: >60 mL/min (ref >60)
GLUCOSE: 116 mg/dL — AB (ref 65–99)
Potassium: 3.9 mmol/L (ref 3.5–5.1)
Sodium: 138 mmol/L (ref 135–145)
TOTAL PROTEIN: 6.3 g/dL — AB (ref 6.5–8.1)

## 2016-03-24 LAB — URINALYSIS, ROUTINE W REFLEX MICROSCOPIC
Bilirubin Urine: NEGATIVE
GLUCOSE, UA: 50 mg/dL — AB
HGB URINE DIPSTICK: NEGATIVE
Ketones, ur: NEGATIVE mg/dL
Leukocytes, UA: NEGATIVE
Nitrite: NEGATIVE
Protein, ur: NEGATIVE mg/dL
SPECIFIC GRAVITY, URINE: 1.006 (ref 1.005–1.030)
pH: 8 (ref 5.0–8.0)

## 2016-03-24 LAB — CBC WITH DIFFERENTIAL/PLATELET
BASOS ABS: 0 10*3/uL (ref 0.0–0.1)
BASOS PCT: 0 %
Eosinophils Absolute: 0.1 10*3/uL (ref 0.0–0.7)
Eosinophils Relative: 1 %
HEMATOCRIT: 36 % (ref 36.0–46.0)
Hemoglobin: 12.6 g/dL (ref 12.0–15.0)
Lymphocytes Relative: 22 %
Lymphs Abs: 2.5 10*3/uL (ref 0.7–4.0)
MCH: 33.5 pg (ref 26.0–34.0)
MCHC: 35 g/dL (ref 30.0–36.0)
MCV: 95.7 fL (ref 78.0–100.0)
MONOS PCT: 6 %
Monocytes Absolute: 0.7 10*3/uL (ref 0.1–1.0)
NEUTROS ABS: 8.2 10*3/uL — AB (ref 1.7–7.7)
NEUTROS PCT: 71 %
Platelets: 291 10*3/uL (ref 150–400)
RBC: 3.76 MIL/uL — AB (ref 3.87–5.11)
RDW: 14 % (ref 11.5–15.5)
WBC: 11.5 10*3/uL — AB (ref 4.0–10.5)

## 2016-03-24 LAB — PROTEIN / CREATININE RATIO, URINE
Creatinine, Urine: 32 mg/dL
Total Protein, Urine: <6 mg/dL

## 2016-03-24 MED ORDER — BUTALBITAL-APAP-CAFFEINE 50-325-40 MG PO TABS
2.0000 | ORAL_TABLET | Freq: Once | ORAL | Status: AC
  Administered 2016-03-24: 2 via ORAL
  Filled 2016-03-24: qty 2

## 2016-03-24 MED ORDER — BUTALBITAL-APAP-CAFFEINE 50-325-40 MG PO TABS: 1.0000 | ORAL_TABLET | Freq: Four times a day (QID) | ORAL | 0 refills | Status: DC | PRN

## 2016-03-24 NOTE — Progress Notes (Signed)
EFM 19min tracing on 03/24/16 @ 1141 was recorded under wrong pt. Tracing belongs to ON#[id number].

## 2016-03-24 NOTE — MAU Note (Signed)
Pt complains of a HA that started on Thursday, unrelieved by tylenol. She also complains of abdominal pain that started on Saturday that has continued without relief. Denies LOF or vaginal bleeding.

## 2016-03-24 NOTE — MAU Provider Note (Signed)
History     CSN: [id number]  Arrival date and time: 03/24/16 0909   First Provider Initiated Contact with Patient 03/24/16 0920      Chief Complaint  Patient presents with  . Headache   HPI [name] is a 36 y.o. G3P2002 at 5753w5d who presents to MAU today with complaint of headache, blurred vision, lower abdominal pain and low back pain for the last few days. The patient has a history of eclampsia with a previous pregnancy. She states that she has tried Tylenol without relief. She rates her headache at 9/10 now and abdominal/back pain at 5/10 now. She denies vaginal bleeding, discharge, LOF today. She has occasional contractions. She reports good fetal movement.    OB History    Gravida Para Term Preterm AB Living   3 2 2     2    SAB TAB Ectopic Multiple Live Births           2      Past Medical History:  Diagnosis Date  . Gestational diabetes   . Hypertension   . Seizure disorder during pregnancy [facility](HCC) 2012    Past Surgical History:  Procedure Laterality Date  . NO PAST SURGERIES      History reviewed. No pertinent family history.  Social History  Substance Use Topics  . Smoking status: Never Smoker  . Smokeless tobacco: Never Used  . Alcohol use No    Allergies: No Known Allergies  No prescriptions prior to admission.    Review of Systems  Constitutional: Negative for fever.  Cardiovascular: Negative for leg swelling.  Gastrointestinal: Positive for abdominal pain. Negative for constipation, diarrhea, nausea and vomiting.  Genitourinary: Negative for dysuria, frequency, urgency, vaginal bleeding and vaginal discharge.  Neurological: Positive for dizziness and headaches. Negative for syncope.   Physical Exam   Blood pressure 127/67, pulse 97, temperature 98 F (36.7 C), temperature source Oral, resp. rate 18, height 5\' 4"  (1.626 m), weight 193 lb (87.5 kg), last menstrual period 09/26/2015, SpO2 100 %.  Physical Exam  Nursing note and  vitals reviewed. Constitutional: She is oriented to person, place, and time. She appears well-developed and well-nourished. No distress.  HENT:  Head: Normocephalic and atraumatic.  Cardiovascular: Normal rate.   Respiratory: Effort normal.  GI: Soft. She exhibits no distension and no mass. There is no tenderness. There is no rebound and no guarding.  Musculoskeletal: She exhibits no edema.  Neurological: She is alert and oriented to person, place, and time. She has normal reflexes.  No clonus  Skin: Skin is warm and dry. No erythema.  Psychiatric: She has a normal mood and affect.  Dilation: Closed Effacement (%): Thick Cervical Position: Posterior Exam by:: Magnus SinningWenzel, PA-C  Results for orders placed or performed during the hospital encounter of 03/24/16 (from the past 24 hour(s))  Protein / creatinine ratio, urine     Status: None   Collection Time: 03/24/16  9:15 AM  Result Value Ref Range   Creatinine, Urine 32.00 mg/dL   Total Protein, Urine <6 mg/dL   Protein Creatinine Ratio        0.00 - 0.15 mg/mg[Cre]  Urinalysis, Routine w reflex microscopic     Status: Abnormal   Collection Time: 03/24/16  9:15 AM  Result Value Ref Range   Color, Urine YELLOW YELLOW   APPearance HAZY (A) CLEAR   Specific Gravity, Urine 1.006 1.005 - 1.030   pH 8.0 5.0 - 8.0   Glucose, UA 50 (A) NEGATIVE  mg/dL   Hgb urine dipstick NEGATIVE NEGATIVE   Bilirubin Urine NEGATIVE NEGATIVE   Ketones, ur NEGATIVE NEGATIVE mg/dL   Protein, ur NEGATIVE NEGATIVE mg/dL   Nitrite NEGATIVE NEGATIVE   Leukocytes, UA NEGATIVE NEGATIVE  CBC with Differential/Platelet     Status: Abnormal   Collection Time: 03/24/16 10:02 AM  Result Value Ref Range   WBC 11.5 (H) 4.0 - 10.5 K/uL   RBC 3.76 (L) 3.87 - 5.11 MIL/uL   Hemoglobin 12.6 12.0 - 15.0 g/dL   HCT 56.3 87.5 - 64.3 %   MCV 95.7 78.0 - 100.0 fL   MCH 33.5 26.0 - 34.0 pg   MCHC 35.0 30.0 - 36.0 g/dL   RDW 32.9 51.8 - 84.1 %   Platelets 291 150 - 400 K/uL    Neutrophils Relative % 71 %   Neutro Abs 8.2 (H) 1.7 - 7.7 K/uL   Lymphocytes Relative 22 %   Lymphs Abs 2.5 0.7 - 4.0 K/uL   Monocytes Relative 6 %   Monocytes Absolute 0.7 0.1 - 1.0 K/uL   Eosinophils Relative 1 %   Eosinophils Absolute 0.1 0.0 - 0.7 K/uL   Basophils Relative 0 %   Basophils Absolute 0.0 0.0 - 0.1 K/uL  Comprehensive metabolic panel     Status: Abnormal   Collection Time: 03/24/16 10:02 AM  Result Value Ref Range   Sodium 138 135 - 145 mmol/L   Potassium 3.9 3.5 - 5.1 mmol/L   Chloride 108 101 - 111 mmol/L   CO2 23 22 - 32 mmol/L   Glucose, Bld 116 (H) 65 - 99 mg/dL   BUN 5 (L) 6 - 20 mg/dL   Creatinine, Ser 6.60 0.44 - 1.00 mg/dL   Calcium 9.3 8.9 - 63.0 mg/dL   Total Protein 6.3 (L) 6.5 - 8.1 g/dL   Albumin 2.9 (L) 3.5 - 5.0 g/dL   AST 17 15 - 41 U/L   ALT 15 14 - 54 U/L   Alkaline Phosphatase 38 38 - 126 U/L   Total Bilirubin 0.3 0.3 - 1.2 mg/dL   GFR calc non Af Amer >60 >60 mL/min   GFR calc Af Amer >60 >60 mL/min   Anion gap 7 5 - 15    Fetal Monitoring: Baseline: 140 bpm Variability: moderate Accelerations: 10 x 10 Decelerations: none Contractions: 2 contractions noted after cervical exam only, mild UI  Patient Vitals for the past 24 hrs:  BP Temp Temp src Pulse Resp SpO2 Height Weight  03/24/16 1051 127/67 98 F (36.7 C) Oral 97 18 100 % - -  03/24/16 1048 127/67 - - 97 - - - -  03/24/16 1047 - - - 100 - 96 % - -  03/24/16 1019 (!) 105/55 - - (!) 101 - - - -  03/24/16 1003 129/64 - - (!) 103 - - - -  03/24/16 0950 - 98 F (36.7 C) Oral - - 100 % 5\' 4"  (1.626 m) 193 lb (87.5 kg)  03/24/16 0947 129/64 - - 98 - 95 % - -  03/24/16 0932 139/72 - - (!) 105 - - - -  03/24/16 0930 (!) 142/76 - - (!) 106 - - - -    MAU Course  Procedures None  MDM UA, CBC, CMP and Urine protein/creatinine ratio today  Serial BPs  2 Fioricet given for headache - some improvement noted BP is stable. HA improved with Fioricet.  Assessment and Plan   A: SIUP at [redacted]w[redacted]d Headache Round ligament pain  P:  Discharge home Rx for Fioricet given to patient  Preterm labor and pre-eclampsia precautions discussed Patient advised to follow-up with [facility] as scheduled for routine prenatal care or sooner PRN Patient may return to MAU as needed or if her condition were to change or worsen   Marny Lowenstein, PA-C  03/24/2016, 11:31 AM

## 2016-03-24 NOTE — Discharge Instructions (Signed)
Round Ligament Pain The round ligament is a cord of muscle and tissue that helps to support the uterus. It can become a source of pain during pregnancy if it becomes stretched or twisted as the baby grows. The pain usually begins in the second trimester of pregnancy, and it can come and go until the baby is delivered. It is not a serious problem, and it does not cause harm to the baby. Round ligament pain is usually a short, sharp, and pinching pain, but it can also be a dull, lingering, and aching pain. The pain is felt in the lower side of the abdomen or in the groin. It usually starts deep in the groin and moves up to the outside of the hip area. Pain can occur with:  A sudden change in position.  Rolling over in bed.  Coughing or sneezing.  Physical activity. Follow these instructions at home: Watch your condition for any changes. Take these steps to help with your pain:  When the pain starts, relax. Then try:  Sitting down.  Flexing your knees up to your abdomen.  Lying on your side with one pillow under your abdomen and another pillow between your legs.  Sitting in a warm bath for 15-20 minutes or until the pain goes away.  Take over-the-counter and prescription medicines only as told by your health care provider.  Move slowly when you sit and stand.  Avoid long walks if they cause pain.  Stop or lessen your physical activities if they cause pain. Contact a health care provider if:  Your pain does not go away with treatment.  You feel pain in your back that you did not have before.  Your medicine is not helping. Get help right away if:  You develop a fever or chills.  You develop uterine contractions.  You develop vaginal bleeding.  You develop nausea or vomiting.  You develop diarrhea.  You have pain when you urinate. This information is not intended to replace advice given to you by your health care provider. Make sure you discuss any questions you have with  your health care provider. Document Released: 10/01/2007 Document Revised: 05/30/2015 Document Reviewed: 02/28/2014 Elsevier Interactive Patient Education  2017 Elsevier Inc. Tension Headache A tension headache is pain, pressure, or aching that is felt over the front and sides of your head. These headaches can last from 30 minutes to several days. Follow these instructions at home: Managing pain   Take over-the-counter and prescription medicines only as told by your doctor.  Lie down in a dark, quiet room when you have a headache.  If directed, apply ice to your head and neck area:  Put ice in a plastic bag.  Place a towel between your skin and the bag.  Leave the ice on for 20 minutes, 2-3 times per day.  Use a heating pad or a hot shower to apply heat to your head and neck area as told by your doctor. Eating and drinking   Eat meals on a regular schedule.  Do not drink a lot of alcohol.  Do not use a lot of caffeine, or stop using caffeine. General instructions   Keep all follow-up visits as told by your doctor. This is important.  Keep a journal to find out if certain things bring on headaches. For example, write down:  What you eat and drink.  How much sleep you get.  Any change to your diet or medicines.  Try getting a massage, or doing other  things that help you to relax.  Lessen stress.  Sit up straight. Do not tighten (tense) your muscles.  Do not use tobacco products. This includes cigarettes, chewing tobacco, or e-cigarettes. If you need help quitting, ask your doctor.  Exercise regularly as told by your doctor.  Get enough sleep. This may mean 7-9 hours of sleep. Contact a doctor if:  Your symptoms are not helped by medicine.  You have a headache that feels different from your usual headache.  You feel sick to your stomach (nauseous) or you throw up (vomit).  You have a fever. Get help right away if:  Your headache becomes very bad.  You  keep throwing up.  You have a stiff neck.  You have trouble seeing.  You have trouble speaking.  You have pain in your eye or ear.  Your muscles are weak or you lose muscle control.  You lose your balance or you have trouble walking.  You feel like you will pass out (faint) or you pass out.  You have confusion. This information is not intended to replace advice given to you by your health care provider. Make sure you discuss any questions you have with your health care provider. Document Released: 03/18/2009 Document Revised: 08/22/2015 Document Reviewed: 04/16/2014 Elsevier Interactive Patient Education  2017 ArvinMeritorElsevier Inc.

## 2016-04-09 ENCOUNTER — Inpatient Hospital Stay (HOSPITAL_COMMUNITY): Payer: Self-pay

## 2016-04-09 ENCOUNTER — Encounter (HOSPITAL_COMMUNITY): Payer: Self-pay | Admitting: *Deleted

## 2016-04-09 ENCOUNTER — Inpatient Hospital Stay (HOSPITAL_COMMUNITY)
Admission: AD | Admit: 2016-04-09 | Discharge: 2016-04-09 | Disposition: A | Discharge status: Home / Self Care | Payer: Self-pay | Source: Ambulatory Visit | Attending: Obstetrics & Gynecology | Admitting: Obstetrics & Gynecology

## 2016-04-09 DIAGNOSIS — O9989 Other specified diseases and conditions complicating pregnancy, childbirth and the puerperium: Secondary | ICD-10-CM

## 2016-04-09 DIAGNOSIS — O99343 Other mental disorders complicating pregnancy, third trimester: Secondary | ICD-10-CM | POA: Insufficient documentation

## 2016-04-09 DIAGNOSIS — O99353 Diseases of the nervous system complicating pregnancy, third trimester: Secondary | ICD-10-CM | POA: Insufficient documentation

## 2016-04-09 DIAGNOSIS — O26893 Other specified pregnancy related conditions, third trimester: Secondary | ICD-10-CM | POA: Insufficient documentation

## 2016-04-09 DIAGNOSIS — Z3A28 28 weeks gestation of pregnancy: Secondary | ICD-10-CM | POA: Insufficient documentation

## 2016-04-09 DIAGNOSIS — K529 Noninfective gastroenteritis and colitis, unspecified: Secondary | ICD-10-CM | POA: Insufficient documentation

## 2016-04-09 DIAGNOSIS — O280 Abnormal hematological finding on antenatal screening of mother: Secondary | ICD-10-CM

## 2016-04-09 DIAGNOSIS — O163 Unspecified maternal hypertension, third trimester: Secondary | ICD-10-CM | POA: Insufficient documentation

## 2016-04-09 DIAGNOSIS — G40909 Epilepsy, unspecified, not intractable, without status epilepticus: Secondary | ICD-10-CM | POA: Insufficient documentation

## 2016-04-09 DIAGNOSIS — F419 Anxiety disorder, unspecified: Secondary | ICD-10-CM | POA: Insufficient documentation

## 2016-04-09 DIAGNOSIS — R1011 Right upper quadrant pain: Secondary | ICD-10-CM

## 2016-04-09 DIAGNOSIS — F329 Major depressive disorder, single episode, unspecified: Secondary | ICD-10-CM | POA: Insufficient documentation

## 2016-04-09 DIAGNOSIS — O24419 Gestational diabetes mellitus in pregnancy, unspecified control: Secondary | ICD-10-CM | POA: Insufficient documentation

## 2016-04-09 HISTORY — DX: Unspecified fracture of left foot, initial encounter for closed fracture: S92.902A

## 2016-04-09 HISTORY — DX: Major depressive disorder, single episode, unspecified: F32.9

## 2016-04-09 HISTORY — DX: Depression, unspecified: F32.A

## 2016-04-09 HISTORY — DX: Anxiety disorder, unspecified: F41.9

## 2016-04-09 HISTORY — DX: Gestational (pregnancy-induced) hypertension without significant proteinuria, unspecified trimester: O13.9

## 2016-04-09 LAB — CBC
HCT: 39 % (ref 36.0–46.0)
Hemoglobin: 13.6 g/dL (ref 12.0–15.0)
MCH: 33.3 pg (ref 26.0–34.0)
MCHC: 34.9 g/dL (ref 30.0–36.0)
MCV: 95.6 fL (ref 78.0–100.0)
Platelets: 278 10*3/uL (ref 150–400)
RBC: 4.08 MIL/uL (ref 3.87–5.11)
RDW: 14 % (ref 11.5–15.5)
WBC: 18.7 10*3/uL — AB (ref 4.0–10.5)

## 2016-04-09 LAB — COMPREHENSIVE METABOLIC PANEL
ALBUMIN: 3 g/dL — AB (ref 3.5–5.0)
ALK PHOS: 49 U/L (ref 38–126)
ALT: 16 U/L (ref 14–54)
AST: 20 U/L (ref 15–41)
Anion gap: 10 (ref 5–15)
BUN: 8 mg/dL (ref 6–20)
CO2: 19 mmol/L — ABNORMAL LOW (ref 22–32)
Calcium: 8 mg/dL — ABNORMAL LOW (ref 8.9–10.3)
Chloride: 107 mmol/L (ref 101–111)
Creatinine, Ser: 0.46 mg/dL (ref 0.44–1.00)
GFR calc Af Amer: >60 mL/min (ref >60)
GLUCOSE: 93 mg/dL (ref 65–99)
Potassium: 3.5 mmol/L (ref 3.5–5.1)
Sodium: 136 mmol/L (ref 135–145)
TOTAL PROTEIN: 6.6 g/dL (ref 6.5–8.1)
Total Bilirubin: 0.6 mg/dL (ref 0.3–1.2)

## 2016-04-09 LAB — URINALYSIS, ROUTINE W REFLEX MICROSCOPIC
Bilirubin Urine: NEGATIVE
Glucose, UA: NEGATIVE mg/dL
HGB URINE DIPSTICK: NEGATIVE
Ketones, ur: 20 mg/dL — AB
LEUKOCYTES UA: NEGATIVE
Nitrite: NEGATIVE
Protein, ur: NEGATIVE mg/dL
SPECIFIC GRAVITY, URINE: 1.019 (ref 1.005–1.030)
pH: 6 (ref 5.0–8.0)

## 2016-04-09 LAB — PROTEIN / CREATININE RATIO, URINE
Creatinine, Urine: 130 mg/dL
Protein Creatinine Ratio: 0.12 mg/mg{Cre} (ref 0.00–0.15)
Total Protein, Urine: 16 mg/dL

## 2016-04-09 LAB — LIPASE, BLOOD: LIPASE: 15 U/L (ref 11–51)

## 2016-04-09 MED ORDER — ONDANSETRON 8 MG PO TBDP
8.0000 mg | ORAL_TABLET | Freq: Once | ORAL | Status: AC
  Administered 2016-04-09: 8 mg via ORAL
  Filled 2016-04-09: qty 1

## 2016-04-09 MED ORDER — RANITIDINE HCL 150 MG PO TABS: 150.0000 mg | ORAL_TABLET | Freq: Two times a day (BID) | ORAL | 2 refills | Status: DC

## 2016-04-09 MED ORDER — GI COCKTAIL ~~LOC~~
30.0000 mL | Freq: Once | ORAL | Status: AC
  Administered 2016-04-09: 30 mL via ORAL
  Filled 2016-04-09: qty 30

## 2016-04-09 MED ORDER — OXYCODONE-ACETAMINOPHEN 5-325 MG PO TABS
2.0000 | ORAL_TABLET | Freq: Once | ORAL | Status: AC
  Administered 2016-04-09: 2 via ORAL
  Filled 2016-04-09: qty 2

## 2016-04-09 MED ORDER — PROMETHAZINE HCL 25 MG PO TABS
12.5000 mg | ORAL_TABLET | Freq: Four times a day (QID) | ORAL | 2 refills | Status: DC | PRN
Start: 2016-04-09 — End: 2016-04-17

## 2016-04-09 MED ORDER — FAMOTIDINE 20 MG PO TABS
40.0000 mg | ORAL_TABLET | Freq: Once | ORAL | Status: AC
  Administered 2016-04-09: 40 mg via ORAL
  Filled 2016-04-09: qty 2

## 2016-04-09 NOTE — MAU Note (Signed)
RN to the [facility], CNM discussing discharge instructions with patient.  Patient verbalized understanding, e-signature obtained, hard copy of discharge instructions given.  Patient has family visiting in the hospital, they will take her home.

## 2016-04-09 NOTE — MAU Note (Signed)
Pt C/O upper abdominal pain started this morning, also vomiting & diarrhea, feeling very weak, numbness in hands & face.  Vomiting & diarrhea started this morning around 0630 - 2 episodes of diarrhea.  Denies bleeding, LOF, or contractions.

## 2016-04-09 NOTE — MAU Note (Signed)
Pain in epigastric area, radiates up. No at home is sick. When pain is going on, makes it hard to breath

## 2016-04-09 NOTE — MAU Provider Note (Signed)
Chief Complaint:  Abdominal Pain; Emesis During Pregnancy; and Diarrhea   First Provider Initiated Contact with Patient 04/09/16 1218      HPI: [name] Hull is a 36 y.o. G3P2002 at [redacted]w[redacted]d who presents to maternity admissions reporting pain in her upper abdomen, nausea/vomiting with emesis x2 and diarrhea x2 this morning. She reports intermittent chills associated with her symptoms. She has not tried any treatments. She reports the pain is in her upper abdomen, epigastric area and RUQ, acute onset, constant, 10/10 pain that is sharp and unchanged since onset.  She denies sick contacts. She denies pain in her lower abdomen or contractions.  She reports a h/a that was mild when she arrived is worsening in MAU. She reports good fetal movement, denies LOF, vaginal bleeding, vaginal itching/burning, urinary symptoms, or dizziness  HPI  Past Medical History: Past Medical History:  Diagnosis Date  . Anxiety   . Depression   . Foot fracture, left    as child  . Gestational diabetes   . Hypertension   . Pregnancy induced hypertension   . Seizure disorder during pregnancy (HCC) 2012    Past obstetric history: OB History  Gravida Para Term Preterm AB Living  SAB TAB Ectopic Multiple Live Births          2    # Outcome Date GA Lbr Len/2nd Weight Sex Delivery Anes PTL Lv  3 Current           2 Term 05/14/14 [redacted]w[redacted]d  8 lb (3.629 kg) F Vag-Spont EPI N LIV     Complications: Gestational diabetes,PIH (pregnancy induced hypertension)  1 Term 11/10/10 [redacted]w[redacted]d  8 lb (3.629 kg) F Vag-Spont EPI N LIV     Complications: Gestational diabetes      Past Surgical History: Past Surgical History:  Procedure Laterality Date  . NO PAST SURGERIES      Family History: Family History  Problem Relation Age of Onset  . Hypertension Mother   . Diabetes Mother     Social History: Social History  Substance Use Topics  . Smoking status: Never Smoker  . Smokeless tobacco: Never Used   . Alcohol use No    Allergies: No Known Allergies  Meds:  No prescriptions prior to admission.    ROS:  Review of Systems  Constitutional: Negative for chills, fatigue and fever.  Eyes: Negative for visual disturbance.  Respiratory: Negative for shortness of breath.   Cardiovascular: Negative for chest pain.  Gastrointestinal: Positive for abdominal pain, diarrhea, nausea and vomiting.  Genitourinary: Negative for difficulty urinating, dysuria, flank pain, pelvic pain, vaginal bleeding, vaginal discharge and vaginal pain.  Neurological: Positive for headaches. Negative for dizziness.  Psychiatric/Behavioral: Negative.      I have reviewed patient's Past Medical Hx, Surgical Hx, Family Hx, Social Hx, medications and allergies.   Physical Exam   Patient Vitals for the past 24 hrs:  BP Temp Temp src Pulse Resp SpO2  04/09/16 1913 - - - - - 98 %  04/09/16 1911 (!) 118/59 - - (!) 117 18 -  04/09/16 1853 132/60 (!) 101.1 F (38.4 C) Axillary (!) 110 18 -  04/09/16 1852 - 99.7 F (37.6 C) Oral - - -  04/09/16 1513 (!) 120/54 - - (!) 115 18 97 %  04/09/16 1302 - - - (!) 112 - 95 %  04/09/16 1300 - - - (!) 108 - 95 %  04/09/16 1247 (!) 116/53 - - Marland Kitchen)  109 - -  04/09/16 1231 (!) 136/48 - - (!) 107 - -  04/09/16 1216 (!) 135/48 - - (!) 104 - -  04/09/16 1205 (!) 124/58 - - (!) 114 20 -  04/09/16 1039 (!) 121/53 98 F (36.7 C) Oral (!) 114 18 -   Constitutional: Well-developed, well-nourished female in no acute distress.  Cardiovascular: normal rate Respiratory: normal effort GI: Abd soft, non-tender, gravid appropriate for gestational age.  MS: Extremities nontender, no edema, normal ROM Neurologic: Alert and oriented x 4.  GU: Neg CVAT.  PELVIC EXAM: Cervix pink, visually closed, without lesion, scant white creamy discharge, vaginal walls and external genitalia normal Bimanual exam: Cervix 0/long/high, firm, anterior, neg CMT, uterus nontender, nonenlarged, adnexa without  tenderness, enlargement, or mass     FHT:  Baseline 155 , moderate variability, accelerations present, no decelerations Contractions: None on toco or to palpation    Labs: Results for orders placed or performed during the hospital encounter of 04/09/16 (from the past 24 hour(s))  Urinalysis, Routine w reflex microscopic     Status: Abnormal   Collection Time: 04/09/16 10:45 AM  Result Value Ref Range   Color, Urine YELLOW YELLOW   APPearance HAZY (A) CLEAR   Specific Gravity, Urine 1.019 1.005 - 1.030   pH 6.0 5.0 - 8.0   Glucose, UA NEGATIVE NEGATIVE mg/dL   Hgb urine dipstick NEGATIVE NEGATIVE   Bilirubin Urine NEGATIVE NEGATIVE   Ketones, ur 20 (A) NEGATIVE mg/dL   Protein, ur NEGATIVE NEGATIVE mg/dL   Nitrite NEGATIVE NEGATIVE   Leukocytes, UA NEGATIVE NEGATIVE  Protein / creatinine ratio, urine     Status: None   Collection Time: 04/09/16 10:45 AM  Result Value Ref Range   Creatinine, Urine 130.00 mg/dL   Total Protein, Urine 16 mg/dL   Protein Creatinine Ratio 0.12 0.00 - 0.15 mg/mg[Cre]  CBC     Status: Abnormal   Collection Time: 04/09/16 12:43 PM  Result Value Ref Range   WBC 18.7 (H) 4.0 - 10.5 K/uL   RBC 4.08 3.87 - 5.11 MIL/uL   Hemoglobin 13.6 12.0 - 15.0 g/dL   HCT 16.1 09.6 - 04.5 %   MCV 95.6 78.0 - 100.0 fL   MCH 33.3 26.0 - 34.0 pg   MCHC 34.9 30.0 - 36.0 g/dL   RDW 40.9 81.1 - 91.4 %   Platelets 278 150 - 400 K/uL  Comprehensive metabolic panel     Status: Abnormal   Collection Time: 04/09/16 12:43 PM  Result Value Ref Range   Sodium 136 135 - 145 mmol/L   Potassium 3.5 3.5 - 5.1 mmol/L   Chloride 107 101 - 111 mmol/L   CO2 19 (L) 22 - 32 mmol/L   Glucose, Bld 93 65 - 99 mg/dL   BUN 8 6 - 20 mg/dL   Creatinine, Ser 7.82 0.44 - 1.00 mg/dL   Calcium 8.0 (L) 8.9 - 10.3 mg/dL   Total Protein 6.6 6.5 - 8.1 g/dL   Albumin 3.0 (L) 3.5 - 5.0 g/dL   AST 20 15 - 41 U/L   ALT 16 14 - 54 U/L   Alkaline Phosphatase 49 38 - 126 U/L   Total Bilirubin 0.6  0.3 - 1.2 mg/dL   GFR calc non Af Amer >60 >60 mL/min   GFR calc Af Amer >60 >60 mL/min   Anion gap 10 5 - 15  Lipase, blood     Status: None   Collection Time: 04/09/16 12:43 PM  Result  Value Ref Range   Lipase 15 11 - 51 U/L   O/Positive/-- (12/07 0000)  Imaging:  US Abdomen Limited Ruq  Result Date: 04/09/2016 CLINICAL DATA:  Right upper quadrant pain, epigastric pain, [redacted] weeks pregnant, nausea and vomiting this morning EXAM: US ABDOMEN LIMITED - RIGHT UPPER QUADRANT COMPARISON:  None. FINDINGS: Gallbladder: No gallstones or wall thickening visualized. No sonographic Murphy sign noted by sonographer. Common bile duct: Diameter: 4.6 mm in diameter within normal limits Liver: No focal lesion identified. Within normal limits in parenchymal echogenicity. IMPRESSION: No gallstones are noted within gallbladder. No sonographic Murphy's sign. Normal CBD. Normal liver echogenicity. Electronically Signed   By: Natasha Mead M.D.   On: 04/09/2016 16:54    MAU Course/MDM: I have ordered labs and reviewed results.  NST reviewed RUQ [place] ordered and results reviewed Consult Dr Erin Fulling with presentation, exam findings and test results.  Treatments in MAU included GI Cocktail, Zofran 8 mg ODT, Pepcid 40 mg, and Percocet 5/325 x 2 tabs. No evidence of cholecystitis or appendicitis, no acute abdomen, no s/sx of preterm labor.   Likely gastroenteritis, either food related or viral D/C home with Rx for Pepcid and Phenergan Pt stable at time of discharge.  Assessment: 1. Noninfectious gastroenteritis, unspecified type      2 RUQ abdominal pain     Plan: Discharge home Labor precautions and fetal kick counts  Follow-up Information    [facility].   Specialty:  Obstetrics and Gynecology Why:  As scheduled, return to MAU as needed for emergencies. Contact information: [address] [place] [address] [phone]         Allergies as  of 04/09/2016   No Known Allergies     Medication List    STOP taking these medications   butalbital-acetaminophen-caffeine 50-325-40 MG tablet Commonly known as:  FIORICET, ESGIC     TAKE these medications   aspirin 81 MG tablet Take 1 tablet (81 mg total) by mouth daily.   PRENATAL VITAMIN PO Take by mouth.   promethazine 25 MG tablet Commonly known as:  PHENERGAN Take 0.5-1 tablets (12.5-25 mg total) by mouth every 6 (six) hours as needed.   ranitidine 150 MG tablet Commonly known as:  ZANTAC Take 1 tablet (150 mg total) by mouth 2 (two) times daily.   TYLENOL 325 MG tablet Generic drug:  acetaminophen Take 650 mg by mouth every 6 (six) hours as needed.       Sharen Counter Certified Nurse-Midwife 04/09/2016 9:42 PM

## 2016-04-15 ENCOUNTER — Encounter: Payer: Self-pay | Admitting: Obstetrics and Gynecology

## 2016-04-17 ENCOUNTER — Ambulatory Visit (INDEPENDENT_AMBULATORY_CARE_PROVIDER_SITE_OTHER): Payer: Self-pay | Admitting: Obstetrics and Gynecology

## 2016-04-17 VITALS — BP 125/53 | HR 89 | Wt 191.1 lb

## 2016-04-17 DIAGNOSIS — O09522 Supervision of elderly multigravida, second trimester: Secondary | ICD-10-CM

## 2016-04-17 DIAGNOSIS — O09523 Supervision of elderly multigravida, third trimester: Secondary | ICD-10-CM

## 2016-04-17 DIAGNOSIS — Z23 Encounter for immunization: Secondary | ICD-10-CM

## 2016-04-17 DIAGNOSIS — O280 Abnormal hematological finding on antenatal screening of mother: Secondary | ICD-10-CM

## 2016-04-17 DIAGNOSIS — O0993 Supervision of high risk pregnancy, unspecified, third trimester: Secondary | ICD-10-CM

## 2016-04-17 DIAGNOSIS — Z8759 Personal history of other complications of pregnancy, childbirth and the puerperium: Secondary | ICD-10-CM

## 2016-04-17 NOTE — Progress Notes (Signed)
Spanish interpreter "Katherin" # [id number] used for visit 28 Week labs/TDap today

## 2016-04-17 NOTE — Patient Instructions (Signed)
Tercer trimestre de embarazo (Third Trimester of Pregnancy) El tercer trimestre comprende desde la semana29 hasta la semana42, es decir, desde el mes7 hasta el mes9. El tercer trimestre es un perodo en el que el feto crece rpidamente. Hacia el final del noveno mes, el feto mide alrededor de 20pulgadas (45cm) de largo y pesa entre 6 y 10 libras (2,700 y 4,500kg). CAMBIOS EN EL ORGANISMO Su organismo atraviesa por muchos cambios durante el embarazo, y estos varan de una mujer a otra.  Seguir aumentando de peso. Es de esperar que aumente entre 25 y 35libras (11 y 16kg) hacia el final del embarazo.  Podrn aparecer las primeras estras en las caderas, el abdomen y las mamas.  Puede tener necesidad de orinar con ms frecuencia porque el feto baja hacia la pelvis y ejerce presin sobre la vejiga.  Debido al embarazo podr sentir acidez estomacal con frecuencia.  Puede estar estreida, ya que ciertas hormonas enlentecen los movimientos de los msculos que empujan los desechos a travs de los intestinos.  Pueden aparecer hemorroides o abultarse e hincharse las venas (venas varicosas).  Puede sentir dolor plvico debido al aumento de peso y a que las hormonas del embarazo relajan las articulaciones entre los huesos de la pelvis. El dolor de espalda puede ser consecuencia de la sobrecarga de los msculos que soportan la postura.  Tal vez haya cambios en el cabello que pueden incluir su engrosamiento, crecimiento rpido y cambios en la textura. Adems, a algunas mujeres se les cae el cabello durante o despus del embarazo, o tienen el cabello seco o fino. Lo ms probable es que el cabello se le normalice despus del nacimiento del beb.  Las mamas seguirn creciendo y le dolern. A veces, puede haber una secrecin amarilla de las mamas llamada calostro.  El ombligo puede salir hacia afuera.  Puede sentir que le falta el aire debido a que se expande el tero.  Puede notar que el feto  "baja" o lo siente ms bajo, en el abdomen.  Puede tener una prdida de secrecin mucosa con sangre. Esto suele ocurrir en el trmino de unos pocos das a una semana antes de que comience el trabajo de parto.  El cuello del tero se vuelve delgado y blando (se borra) cerca de la fecha de parto. QU DEBE ESPERAR EN LOS EXMENES PRENATALES Le harn exmenes prenatales cada 2semanas hasta la semana36. A partir de ese momento le harn exmenes semanales. Durante una visita prenatal de rutina:  La pesarn para asegurarse de que usted y el feto estn creciendo normalmente.  Le tomarn la presin arterial.  Le medirn el abdomen para controlar el desarrollo del beb.  Se escucharn los latidos cardacos fetales.  Se evaluarn los resultados de los estudios solicitados en visitas anteriores.  Le revisarn el cuello del tero cuando est prxima la fecha de parto para controlar si este se ha borrado. Alrededor de la semana36, el mdico le revisar el cuello del tero. Al mismo tiempo, realizar un anlisis de las secreciones del tejido vaginal. Este examen es para determinar si hay un tipo de bacteria, estreptococo Grupo B. El mdico le explicar esto con ms detalle. El mdico puede preguntarle lo siguiente:  Cmo le gustara que fuera el parto.  Cmo se siente.  Si siente los movimientos del beb.  Si ha tenido sntomas anormales, como prdida de lquido, sangrado, dolores de cabeza intensos o clicos abdominales.  Si est consumiendo algn producto que contenga tabaco, como cigarrillos, tabaco de mascar y   cigarrillos electrnicos.  Si tiene alguna pregunta. Otros exmenes o estudios de deteccin que pueden realizarse durante el tercer trimestre incluyen lo siguiente:  Anlisis de sangre para controlar los niveles de hierro (anemia).  Controles fetales para determinar su salud, nivel de actividad y crecimiento. Si tiene alguna enfermedad o hay problemas durante el embarazo, le harn  estudios.  Prueba del VIH (virus de inmunodeficiencia humana). Si corre un riesgo alto, pueden realizarle una prueba de deteccin del VIH durante el tercer trimestre del embarazo. FALSO TRABAJO DE PARTO Es posible que sienta contracciones leves e irregulares que finalmente desaparecen. Se llaman contracciones de Braxton Hicks o falso trabajo de parto. Las contracciones pueden durar horas, das o incluso semanas, antes de que el verdadero trabajo de parto se inicie. Si las contracciones ocurren a intervalos regulares, se intensifican o se hacen dolorosas, lo mejor es que la revise el mdico. SIGNOS DE TRABAJO DE PARTO  Clicos de tipo menstrual.  Contracciones cada 5minutos o menos.  Contracciones que comienzan en la parte superior del tero y se extienden hacia abajo, a la zona inferior del abdomen y la espalda.  Sensacin de mayor presin en la pelvis o dolor de espalda.  Una secrecin de mucosidad acuosa o con sangre que sale de la vagina. Si tiene alguno de estos signos antes de la semana37 del embarazo, llame a su mdico de inmediato. Debe concurrir al hospital para que la controlen inmediatamente. INSTRUCCIONES PARA EL CUIDADO EN EL HOGAR  Evite fumar, consumir hierbas, beber alcohol y tomar frmacos que no le hayan recetado. Estas sustancias qumicas afectan la formacin y el desarrollo del beb.  No consuma ningn producto que contenga tabaco, lo que incluye cigarrillos, tabaco de mascar y cigarrillos electrnicos. Si necesita ayuda para dejar de fumar, consulte al mdico. Puede recibir asesoramiento y otro tipo de recursos para dejar de fumar.  Siga las indicaciones del mdico en relacin con el uso de medicamentos. Durante el embarazo, hay medicamentos que son seguros de tomar y otros que no.  Haga ejercicio solamente como se lo haya indicado el mdico. Sentir clicos uterinos es un buen signo para detener la actividad fsica.  Contine comiendo alimentos sanos con  regularidad.  Use un sostn que le brinde buen soporte si le duelen las mamas.  No se d baos de inmersin en agua caliente, baos turcos ni saunas.  Use el cinturn de seguridad en todo momento mientras conduce.  No coma carne cruda ni queso sin cocinar; evite el contacto con las bandejas sanitarias de los gatos y la tierra que estos animales usan. Estos elementos contienen grmenes que pueden causar defectos congnitos en el beb.  Tome las vitaminas prenatales.  Tome entre 1500 y 2000mg de calcio diariamente comenzando en la semana20 del embarazo hasta el parto.  Si est estreida, pruebe un laxante suave (si el mdico lo autoriza). Consuma ms alimentos ricos en fibra, como vegetales y frutas frescos y cereales integrales. Beba gran cantidad de lquido para mantener la orina de tono claro o color amarillo plido.  Dese baos de asiento con agua tibia para aliviar el dolor o las molestias causadas por las hemorroides. Use una crema para las hemorroides si el mdico la autoriza.  Si tiene venas varicosas, use medias de descanso. Eleve los pies durante 15minutos, 3 o 4veces por da. Limite el consumo de sal en su dieta.  Evite levantar objetos pesados, use zapatos de tacones bajos y mantenga una buena postura.  Descanse con las piernas elevadas si tiene   calambres o dolor de cintura.  Visite a su dentista si no lo ha hecho durante el embarazo. Use un cepillo de dientes blando para higienizarse los dientes y psese el hilo dental con suavidad.  Puede seguir manteniendo relaciones sexuales, a menos que el mdico le indique lo contrario.  No haga viajes largos excepto que sea absolutamente necesario y solo con la autorizacin del mdico.  Tome clases prenatales para entender, practicar y hacer preguntas sobre el trabajo de parto y el parto.  Haga un ensayo de la partida al hospital.  Prepare el bolso que llevar al hospital.  Prepare la habitacin del beb.  Concurra a todas  las visitas prenatales segn las indicaciones de su mdico.  SOLICITE ATENCIN MDICA SI:  No est segura de que est en trabajo de parto o de que ha roto la bolsa de las aguas.  Tiene mareos.  Siente clicos leves, presin en la pelvis o dolor persistente en el abdomen.  Tiene nuseas, vmitos o diarrea persistentes.  Observa una secrecin vaginal con mal olor.  Siente dolor al orinar.  SOLICITE ATENCIN MDICA DE INMEDIATO SI:  Tiene fiebre.  Tiene una prdida de lquido por la vagina.  Tiene sangrado o pequeas prdidas vaginales.  Siente dolor intenso o clicos en el abdomen.  Sube o baja de peso rpidamente.  Tiene dificultad para respirar y siente dolor de pecho.  Sbitamente se le hinchan mucho el rostro, las manos, los tobillos, los pies o las piernas.  No ha sentido los movimientos del beb durante una hora.  Siente un dolor de cabeza intenso que no se alivia con medicamentos.  Su visin se modifica.  Esta informacin no tiene como fin reemplazar el consejo del mdico. Asegrese de hacerle al mdico cualquier pregunta que tenga. Document Released: 10/01/2004 Document Revised: 01/12/2014 Document Reviewed: 02/23/2012 Elsevier Interactive Patient Education  2017 [employer]vier Inc.  

## 2016-04-17 NOTE — Progress Notes (Signed)
Subjective:  [name] Hull is a 36 y.o. G3P2002 at [redacted]w[redacted]d being seen today for ongoing prenatal care.  She is currently monitored for the following issues for this high-risk pregnancy and has Advanced maternal age in multigravida, second trimester; Abnormal maternal serum screening test; History of eclampsia; and Encounter for supervision of high risk multigravida of advanced maternal age, antepartum on her problem list.  Patient reports no complaints.  Contractions: Not present. Vag. Bleeding: None.  Movement: Present. Denies leaking of fluid.   The following portions of the patient's history were reviewed and updated as appropriate: allergies, current medications, past family history, past medical history, past social history, past surgical history and problem list. Problem list updated.  Objective:   Vitals:   04/17/16 0834  BP: (!) 125/53  Pulse: 89  Weight: 191 lb 1.6 oz (86.7 kg)    Fetal Status: Fetal Heart Rate (bpm): 130   Movement: Present     General:  Alert, oriented and cooperative. Patient is in no acute distress.  Skin: Skin is warm and dry. No rash noted.   Cardiovascular: Normal heart rate noted  Respiratory: Normal respiratory effort, no problems with respiration noted  Abdomen: Soft, gravid, appropriate for gestational age. Pain/Pressure: Present     Pelvic:  Cervical exam deferred        Extremities: Normal range of motion.  Edema: None  Mental Status: Normal mood and affect. Normal behavior. Normal judgment and thought content.   Urinalysis:      Assessment and Plan:  Pregnancy: G3P2002 at [redacted]w[redacted]d  1. Supervision of high risk pregnancy in third trimester Stable - Glucose Tolerance, 2 Hours w/1 Hour - HIV antibody (with reflex) - RPR - CBC  2. Need for Tdap vaccination  - Tdap vaccine greater than or equal to 7yo IM  3. Advanced maternal age in multigravida, second trimester Will need growth scan in third trimester  4. Abnormal maternal serum  screening test Nl Panorama  5. History of eclampsia Continue with BASA qd  Preterm labor symptoms and general obstetric precautions including but not limited to vaginal bleeding, contractions, leaking of fluid and fetal movement were reviewed in detail with the patient. Please refer to After Visit Summary for other counseling recommendations.  Return in about 2 weeks (around 05/01/2016) for OB visit.   Hermina Staggers, MD

## 2016-04-18 LAB — HIV ANTIBODY (ROUTINE TESTING W REFLEX): HIV SCREEN 4TH GENERATION: NONREACTIVE

## 2016-04-18 LAB — CBC
Hematocrit: 39.2 % (ref 34.0–46.6)
Hemoglobin: 13.2 g/dL (ref 11.1–15.9)
MCH: 32.4 pg (ref 26.6–33.0)
MCHC: 33.7 g/dL (ref 31.5–35.7)
MCV: 96 fL (ref 79–97)
Platelets: 328 10*3/uL (ref 150–379)
RBC: 4.08 x10E6/uL (ref 3.77–5.28)
RDW: 13.9 % (ref 12.3–15.4)
WBC: 11.4 10*3/uL — ABNORMAL HIGH (ref 3.4–10.8)

## 2016-04-18 LAB — GLUCOSE TOLERANCE, 2 HOURS W/ 1HR
Glucose, 1 hour: 163 mg/dL (ref 65–179)
Glucose, 2 hour: 115 mg/dL (ref 65–152)
Glucose, Fasting: 84 mg/dL (ref 65–91)

## 2016-04-18 LAB — RPR: RPR Ser Ql: NONREACTIVE

## 2016-04-30 ENCOUNTER — Encounter: Payer: Self-pay | Admitting: *Deleted

## 2016-04-30 ENCOUNTER — Ambulatory Visit (INDEPENDENT_AMBULATORY_CARE_PROVIDER_SITE_OTHER): Payer: Self-pay | Admitting: Obstetrics and Gynecology

## 2016-04-30 VITALS — BP 129/60 | HR 93 | Wt 196.5 lb

## 2016-04-30 DIAGNOSIS — Z789 Other specified health status: Secondary | ICD-10-CM

## 2016-04-30 DIAGNOSIS — O09522 Supervision of elderly multigravida, second trimester: Secondary | ICD-10-CM

## 2016-04-30 DIAGNOSIS — O09529 Supervision of elderly multigravida, unspecified trimester: Secondary | ICD-10-CM

## 2016-04-30 DIAGNOSIS — O280 Abnormal hematological finding on antenatal screening of mother: Secondary | ICD-10-CM

## 2016-04-30 NOTE — Progress Notes (Signed)
Prenatal Visit Note Date: 04/30/2016 [facility]: [facility]  Subjective:  [name] Hull is a 36 y.o. G3P2002 at [redacted]w[redacted]d being seen today for ongoing prenatal care.  She is currently monitored for the following issues for this high-risk pregnancy and has Advanced maternal age in multigravida, second trimester; Abnormal maternal serum screening test; History of eclampsia; and Encounter for supervision of high risk multigravida of advanced maternal age, antepartum on her problem list.  Patient reports no complaints.   Contractions: Irregular. Vag. Bleeding: None.  Movement: Present. Denies leaking of fluid.   The following portions of the patient's history were reviewed and updated as appropriate: allergies, current medications, past family history, past medical history, past social history, past surgical history and problem list. Problem list updated.  Objective:   Vitals:   04/30/16 1428  BP: 129/60  Pulse: 93  Weight: 196 lb 8 oz (89.1 kg)    Fetal Status: Fetal Heart Rate (bpm): 138 Fundal Height: 31 cm Movement: Present     General:  Alert, oriented and cooperative. Patient is in no acute distress.  Skin: Skin is warm and dry. No rash noted.   Cardiovascular: Normal heart rate noted  Respiratory: Normal respiratory effort, no problems with respiration noted  Abdomen: Soft, gravid, appropriate for gestational age. Pain/Pressure: Present     Pelvic:  Cervical exam deferred        Extremities: Normal range of motion.  Edema: Mild pitting, slight indentation  Mental Status: Normal mood and affect. Normal behavior. Normal judgment and thought content.   Urinalysis:      Assessment and Plan:  Pregnancy: G3P2002 at [redacted]w[redacted]d  1. Advanced maternal age in multigravida, second trimester Surveillance growth u/s per mfm recs in 1-2wks - [place] MFM OB FOLLOW UP; Future  2. Abnormal maternal serum screening test See above. Low risk cffdna  3. Encounter for supervision  of high risk multigravida of advanced maternal age, antepartum Routine care. paragard but pt would like to have BTL. D/w her that can do if she has a c-section for any reason but not if she has VD. r/b/a d/w her and she is fine with that. Will have pt sign BTL paperwork for proof of discussion.   Interpreter used  Preterm labor symptoms and general obstetric precautions including but not limited to vaginal bleeding, contractions, leaking of fluid and fetal movement were reviewed in detail with the patient. Please refer to After Visit Summary for other counseling recommendations.  Return in about 2 weeks (around 05/14/2016).   [place] Bing, MD

## 2016-05-05 ENCOUNTER — Encounter (HOSPITAL_COMMUNITY): Payer: Self-pay | Admitting: *Deleted

## 2016-05-05 ENCOUNTER — Inpatient Hospital Stay (HOSPITAL_COMMUNITY)
Admission: AD | Admit: 2016-05-05 | Discharge: 2016-05-06 | Disposition: A | Discharge status: Home / Self Care | Payer: Self-pay | Source: Ambulatory Visit | Attending: Obstetrics and Gynecology | Admitting: Obstetrics and Gynecology

## 2016-05-05 ENCOUNTER — Inpatient Hospital Stay (HOSPITAL_COMMUNITY): Payer: Self-pay

## 2016-05-05 DIAGNOSIS — Z3689 Encounter for other specified antenatal screening: Secondary | ICD-10-CM

## 2016-05-05 DIAGNOSIS — O26853 Spotting complicating pregnancy, third trimester: Secondary | ICD-10-CM

## 2016-05-05 DIAGNOSIS — O99343 Other mental disorders complicating pregnancy, third trimester: Secondary | ICD-10-CM | POA: Insufficient documentation

## 2016-05-05 DIAGNOSIS — O163 Unspecified maternal hypertension, third trimester: Secondary | ICD-10-CM | POA: Insufficient documentation

## 2016-05-05 DIAGNOSIS — O4693 Antepartum hemorrhage, unspecified, third trimester: Secondary | ICD-10-CM | POA: Insufficient documentation

## 2016-05-05 DIAGNOSIS — F419 Anxiety disorder, unspecified: Secondary | ICD-10-CM | POA: Insufficient documentation

## 2016-05-05 DIAGNOSIS — O9989 Other specified diseases and conditions complicating pregnancy, childbirth and the puerperium: Secondary | ICD-10-CM

## 2016-05-05 DIAGNOSIS — F329 Major depressive disorder, single episode, unspecified: Secondary | ICD-10-CM | POA: Insufficient documentation

## 2016-05-05 DIAGNOSIS — Z3A31 31 weeks gestation of pregnancy: Secondary | ICD-10-CM | POA: Insufficient documentation

## 2016-05-05 DIAGNOSIS — Z7982 Long term (current) use of aspirin: Secondary | ICD-10-CM | POA: Insufficient documentation

## 2016-05-05 LAB — URINALYSIS, ROUTINE W REFLEX MICROSCOPIC
BILIRUBIN URINE: NEGATIVE
Glucose, UA: NEGATIVE mg/dL
Ketones, ur: NEGATIVE mg/dL
LEUKOCYTES UA: NEGATIVE
Nitrite: NEGATIVE
PH: 7 (ref 5.0–8.0)
Protein, ur: NEGATIVE mg/dL
SPECIFIC GRAVITY, URINE: 1.009 (ref 1.005–1.030)

## 2016-05-05 NOTE — MAU Provider Note (Signed)
History     CSN: [id number]  Arrival date and time: 05/05/16 2201  First Provider Initiated Contact with Patient 05/05/16 2227   Chief Complaint  Patient presents with  . Vaginal Bleeding   HPI [name] Hull is a 36 y.o. G3P2002 at [redacted]w[redacted]d who presents with vaginal bleeding & abdominal cramping. Intermittent lower abdominal cramping since this morning. Felt every 5-15 minutes. Pain improved this evening. Also reports dark red blood in toilet & on toilet paper since 8 pm tonight. Not bleeding into pad or passing clots. Denies complications with this pregnancy & no hx of PTL in previous pregnancies. Last had intercourse on Sunday. Denies fall or abdominal trauma. Positive fetal movement.   Spanish interpreter at bedside.   OB History    Gravida Para Term Preterm AB Living   SAB TAB Ectopic Multiple Live Births           2      Past Medical History:  Diagnosis Date  . Anxiety   . Depression   . Foot fracture, left    as child  . Gestational diabetes   . Hypertension   . Pregnancy induced hypertension   . Seizure disorder during pregnancy [facility]) 2012    Past Surgical History:  Procedure Laterality Date  . NO PAST SURGERIES      Family History  Problem Relation Age of Onset  . Hypertension Mother   . Diabetes Mother     Social History  Substance Use Topics  . Smoking status: Never Smoker  . Smokeless tobacco: Never Used  . Alcohol use No    Allergies: No Known Allergies  Prescriptions Prior to Admission  Medication Sig Dispense Refill Last Dose  . acetaminophen (TYLENOL) 325 MG tablet Take 650 mg by mouth every 6 (six) hours as needed.   Taking  . aspirin 81 MG tablet Take 1 tablet (81 mg total) by mouth daily. 30 tablet 6 Taking  . loratadine (CLARITIN) 10 MG tablet Take 10 mg by mouth daily.   Taking  . Prenatal Vit-Fe Fumarate-FA (PRENATAL VITAMIN PO) Take by mouth.   Taking    Review of Systems  Constitutional: Negative.    Gastrointestinal: Positive for abdominal pain. Negative for diarrhea, nausea and vomiting.  Genitourinary: Positive for vaginal bleeding. Negative for vaginal discharge.   Physical Exam   Blood pressure (!) 117/58, pulse 97, temperature 98.4 F (36.9 C), temperature source Oral, resp. rate 18, height  (1.626 m), weight 199 lb 4 oz (90.4 kg), last menstrual period 09/26/2015.  Physical Exam  Nursing note and vitals reviewed. Constitutional: She is oriented to person, place, and time. She appears well-developed and well-nourished. No distress.  HENT:  Head: Normocephalic and atraumatic.  Eyes: Conjunctivae are normal. Right eye exhibits no discharge. Left eye exhibits no discharge. No scleral icterus.  Neck: Normal range of motion.  Cardiovascular: Normal rate, regular rhythm and normal heart sounds.   No murmur heard. Respiratory: Effort normal and breath sounds normal. No respiratory distress. She has no wheezes.  GI: Soft. There is no tenderness.  Genitourinary: Cervix exhibits no friability. There is bleeding (small amount of dark red blood in vault, cleared out with 1 fox swab; no active bleeding) in the vagina.  Neurological: She is alert and oriented to person, place, and time.  Skin: Skin is warm and dry. She is not diaphoretic.  Psychiatric: She has a normal mood and affect. Her behavior is  normal. Judgment and thought content normal.   Dilation: Closed (ext FT) Effacement (%): Thick Cervical Position: Posterior Station: -3 Exam by:: Judeth Horn NP   Fetal Tracing:  Baseline: 140 Variability: moderate Accelerations: 15x15 Decelerations: none  Toco: irr UI MAU Course  Procedures Results for orders placed or performed during the hospital encounter of 05/05/16 (from the past 24 hour(s))  Urinalysis, Routine w reflex microscopic     Status: Abnormal   Collection Time: 05/05/16 10:14 PM  Result Value Ref Range   Color, Urine YELLOW YELLOW   APPearance HAZY (A)  CLEAR   Specific Gravity, Urine 1.009 1.005 - 1.030   pH 7.0 5.0 - 8.0   Glucose, UA NEGATIVE NEGATIVE mg/dL   Hgb urine dipstick MODERATE (A) NEGATIVE   Bilirubin Urine NEGATIVE NEGATIVE   Ketones, ur NEGATIVE NEGATIVE mg/dL   Protein, ur NEGATIVE NEGATIVE mg/dL   Nitrite NEGATIVE NEGATIVE   Leukocytes, UA NEGATIVE NEGATIVE   RBC / HPF 0-5 0 - 5 RBC/hpf   WBC, UA 0-5 0 - 5 WBC/hpf   Bacteria, UA RARE (A) NONE SEEN   Squamous Epithelial / LPF 6-30 (A) NONE SEEN   Mucous PRESENT     MDM Reactive fetal tracing No regular ctx palpated, abdomen soft & non tender Cervix closed - unable to send FFN d/t bleeding Ultrasound -- no evidence of abruption, CL normal, normal AFI No blood on pad when patient returned from ultrasound S/w Dr. Jolayne Panther. Will discharge home with good return precautions Assessment and Plan  A: 1. Vaginal bleeding in pregnancy, third trimester   2. [redacted] weeks gestation of pregnancy   3. NST (non-stress test) reactive    P: Discharge home Pelvic rest Discussed reasons to return to MAU  Keep f/u with OB  Judeth Horn 05/05/2016, 10:27 PM

## 2016-05-05 NOTE — Discharge Instructions (Signed)
Reposo plvico (Pelvic Rest) CUNDO SE RECOMIENDA EL REPOSO PLVICO? El reposo plvico puede recomendarse en los siguientes casos:  La placenta cubre de forma parcial o total la abertura del cuello del tero (placenta previa).  Hay sangrado entre la pared del tero y el saco amnitico en el primer trimestre de Psychiatrist (hemorragia subcorinica).  El [place] de parto comienza muy pronto (trabajo de parto prematuro). Segn la salud general de la madre y el feto, el mdico decidir si el reposo plvico es [place]. CMO HAGO REPOSO PLVICO? Durante el tiempo que le indique el mdico:  No tenga relaciones sexuales, estimulacin sexual ni orgasmos.  No use tampones. No se haga duchas vaginales. No se introduzca nada en la vagina.  No levante ningn objeto que pese ms de 10libras (4,5kg).  Evite las actividades que demanden mucho esfuerzo (extenuantes).  Evite las actividades que requieran esfuerzos de los msculos de la pelvis. CUNDO DEBO BUSCAR ATENCIN MDICA? Solicite atencin mdica de inmediato si:  Tiene clicos en la zona inferior del abdomen.  Tiene secrecin de flujo vaginal.  Tiene un dolor sordo en la parte baja de la espalda.  Tiene contracciones regulares.  Tienen tensin uterina. CUNDO DEBO BUSCAR ASISTENCIA MDICA INMEDIATA? Solicite atencin mdica de inmediato si:  Tiene sangrado vaginal y est embarazada. Esta informacin no tiene Theme park manager el consejo del mdico. Asegrese de hacerle al mdico cualquier pregunta que tenga. Document Released: 09/16/2011 Document Revised: 04/15/2015 Document Reviewed: 06/25/2014 Elsevier Interactive Patient Education  2017 Elsevier Inc.    Hemorragia vaginal durante Firefighter (tercer trimestre) (Vaginal Bleeding During Pregnancy, Third Trimester) Durante el embarazo es relativamente frecuente que se presente una pequea hemorragia (manchas). Hay diversos factores que pueden causar hemorragia o Personnel officer. A veces, la hemorragia es normal y no es un problema. No obstante, si esto sucede durante el tercer trimestre, puede ser un signo de afeccin grave de la madre y el beb. Debe informar a su mdico de inmediato si tiene alguna hemorragia vaginal. Algunas causas posibles de hemorragia vaginal durante el tercer trimestre incluyen:  La placenta puede estar cubriendo la apertura del tero de [place] total o parcial (placenta previa).  La placenta puede haberse separado del tero (abrupcin placentaria).  Puede haber infeccin o una masa en el cuello del tero.  Puede ser que se est iniciando el trabajo de parto, entonces se denomina prdida del tapn mucoso.  La placenta puede desarrollarse en la capa muscular del tero (placenta acreta). INSTRUCCIONES PARA EL CUIDADO EN EL HOGAR Controle su afeccin para ver si hay cambios. Las siguientes indicaciones ayudarn a Psychologist, educational [employer] pueda sentir:  Siga las indicaciones del mdico para restringir su actividad. Si el mdico le indica descanso en la cama, debe quedarse en la cama y levantarse solo para ir al bao. No obstante, el mdico puede permitirle que contine realizando tareas livianas.  Si es necesario, organcese para que alguien le ayude con las actividades y responsabilidades cotidianas mientras est en cama.  Lleve un registro de la cantidad y Scientist, forensic de saturacin de las toallas higinicas que Landscape architect. Anote este dato.  No use tampones.No se haga duchas vaginales.  No tenga relaciones sexuales u orgasmos hasta que el mdico la autorice.  Siga los consejos del mdico acerca de levantar objetos pesados, conducir y Education officer, environmental actividad fsica.  Si elimina tejido por la vagina, gurdelo para mostrrselo al [employer].  [place] solo medicamentos de venta libre o recetados, segn las indicaciones del  mdico.  No  tome aspirina, ya que puede causar hemorragias.  Cumpla con todas las visitas de control, segn  le indique su mdico. SOLICITE ATENCIN MDICA SI:  Tiene un sangrado vaginal en cualquier momento del embarazo.  Tiene calambres o dolores de [place].  Tiene fiebre y no puede bajarla con medicamentos. SOLICITE ATENCIN MDICA DE INMEDIATO SI:  Siente calambres intensos o dolor en la espalda o en el vientre (abdomen).  Tiene escalofros.  Tiene una prdida de lquido por la vagina.  Elimina cogulos grandes o tejido por la vagina.  La hemorragia aumenta.  Se siente mareada o dbil.  Se desmaya.  Siente que el beb se mueve menos o no se mueve en absoluto. ASEGRESE DE QUE:  Comprende estas instrucciones.  Controlar su afeccin.  Recibir ayuda de inmediato si no mejora o si empeora. Esta informacin no tiene Theme park manager el consejo del mdico. Asegrese de hacerle al mdico cualquier pregunta que tenga. Document Released: 10/01/2004 Document Revised: 12/27/2012 Document Reviewed: 08/29/2012 Elsevier Interactive Patient Education  2017 [employer].

## 2016-05-05 NOTE — MAU Note (Signed)
PT  SAYS SHE STARTED VAG BLEEDING  AT  8PM-   WHEN SHE WIPED   AND  IN TOILET.    IN TRIAGE -  NOTHING  ON UNDERWEAR.    FEELS  UC - STARTED AT     4 PM.   [facility]-  CLINIC  .   LAST SEX-    Sunday.

## 2016-05-14 ENCOUNTER — Other Ambulatory Visit: Payer: Self-pay | Admitting: Obstetrics and Gynecology

## 2016-05-14 ENCOUNTER — Other Ambulatory Visit (HOSPITAL_COMMUNITY): Payer: Self-pay | Admitting: *Deleted

## 2016-05-14 ENCOUNTER — Ambulatory Visit (HOSPITAL_COMMUNITY)
Admission: RE | Admit: 2016-05-14 | Discharge: 2016-05-14 | Disposition: A | Discharge status: Home / Self Care | Payer: Self-pay | Source: Ambulatory Visit | Attending: Obstetrics and Gynecology | Admitting: Obstetrics and Gynecology

## 2016-05-14 ENCOUNTER — Encounter: Payer: Self-pay | Admitting: Obstetrics and Gynecology

## 2016-05-14 DIAGNOSIS — O09293 Supervision of pregnancy with other poor reproductive or obstetric history, third trimester: Secondary | ICD-10-CM

## 2016-05-14 DIAGNOSIS — O09299 Supervision of pregnancy with other poor reproductive or obstetric history, unspecified trimester: Secondary | ICD-10-CM

## 2016-05-14 DIAGNOSIS — Z3A33 33 weeks gestation of pregnancy: Secondary | ICD-10-CM

## 2016-05-14 DIAGNOSIS — O09523 Supervision of elderly multigravida, third trimester: Secondary | ICD-10-CM

## 2016-05-14 DIAGNOSIS — O3660X0 Maternal care for excessive fetal growth, unspecified trimester, not applicable or unspecified: Secondary | ICD-10-CM | POA: Insufficient documentation

## 2016-05-14 DIAGNOSIS — O283 Abnormal ultrasonic finding on antenatal screening of mother: Secondary | ICD-10-CM | POA: Insufficient documentation

## 2016-05-14 DIAGNOSIS — O09522 Supervision of elderly multigravida, second trimester: Secondary | ICD-10-CM

## 2016-05-14 DIAGNOSIS — O289 Unspecified abnormal findings on antenatal screening of mother: Secondary | ICD-10-CM

## 2016-05-14 DIAGNOSIS — O358XX0 Maternal care for other (suspected) fetal abnormality and damage, not applicable or unspecified: Secondary | ICD-10-CM

## 2016-05-18 ENCOUNTER — Encounter (HOSPITAL_COMMUNITY): Payer: Self-pay

## 2016-05-18 ENCOUNTER — Ambulatory Visit (HOSPITAL_COMMUNITY)
Admission: RE | Admit: 2016-05-18 | Discharge: 2016-05-18 | Disposition: A | Discharge status: Home / Self Care | Payer: Self-pay | Source: Ambulatory Visit | Attending: Obstetrics and Gynecology | Admitting: Obstetrics and Gynecology

## 2016-05-18 DIAGNOSIS — I868 Varicose veins of other specified sites: Secondary | ICD-10-CM | POA: Insufficient documentation

## 2016-05-18 DIAGNOSIS — O358XX0 Maternal care for other (suspected) fetal abnormality and damage, not applicable or unspecified: Secondary | ICD-10-CM | POA: Insufficient documentation

## 2016-05-18 DIAGNOSIS — O280 Abnormal hematological finding on antenatal screening of mother: Secondary | ICD-10-CM

## 2016-05-18 DIAGNOSIS — Z3A33 33 weeks gestation of pregnancy: Secondary | ICD-10-CM | POA: Insufficient documentation

## 2016-05-18 DIAGNOSIS — O26893 Other specified pregnancy related conditions, third trimester: Secondary | ICD-10-CM | POA: Insufficient documentation

## 2016-05-19 ENCOUNTER — Ambulatory Visit (INDEPENDENT_AMBULATORY_CARE_PROVIDER_SITE_OTHER): Payer: Self-pay | Admitting: Obstetrics and Gynecology

## 2016-05-19 VITALS — BP 115/58 | HR 94 | Wt 197.2 lb

## 2016-05-19 DIAGNOSIS — O283 Abnormal ultrasonic finding on antenatal screening of mother: Secondary | ICD-10-CM

## 2016-05-19 DIAGNOSIS — O0990 Supervision of high risk pregnancy, unspecified, unspecified trimester: Secondary | ICD-10-CM

## 2016-05-19 DIAGNOSIS — O09523 Supervision of elderly multigravida, third trimester: Secondary | ICD-10-CM

## 2016-05-19 DIAGNOSIS — Z789 Other specified health status: Secondary | ICD-10-CM

## 2016-05-19 DIAGNOSIS — O09529 Supervision of elderly multigravida, unspecified trimester: Secondary | ICD-10-CM

## 2016-05-19 DIAGNOSIS — Z8759 Personal history of other complications of pregnancy, childbirth and the puerperium: Secondary | ICD-10-CM

## 2016-05-19 DIAGNOSIS — O0993 Supervision of high risk pregnancy, unspecified, third trimester: Secondary | ICD-10-CM

## 2016-05-19 DIAGNOSIS — O3663X0 Maternal care for excessive fetal growth, third trimester, not applicable or unspecified: Secondary | ICD-10-CM

## 2016-05-19 NOTE — Progress Notes (Signed)
Video Interpreter # [id number]

## 2016-05-19 NOTE — Progress Notes (Signed)
Prenatal Visit Note Date: 05/19/2016 Clinic: [facility]  Subjective:  [name] Hull is a 36 y.o. G3P2002 at 2698w5d being seen today for ongoing prenatal care.  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  Patient reports states she's had occasional VB for the past few weeks. Last time was last night and was mixed in mucus; not associated with intercourse. No pain.  Contractions: Irritability. Vag. Bleeding: None.  Movement: Present. Denies leaking of fluid.   The following portions of the patient's history were reviewed and updated as appropriate: allergies, current medications, past family history, past medical history, past social history, past surgical history and problem list. Problem list updated.  Objective:   Vitals:   05/19/16 1433  BP: (!) 115/58  Pulse: 94  Weight: 197 lb 3.2 oz (89.4 kg)    Fetal Status: Fetal Heart Rate (bpm): 149   Movement: Present     General:  Alert, oriented and cooperative. Patient is in no acute distress.  Skin: Skin is warm and dry. No rash noted.   Cardiovascular: Normal heart rate noted  Respiratory: Normal respiratory effort, no problems with respiration noted  Abdomen: Soft, gravid, appropriate for gestational age. Pain/Pressure: Present     Pelvic:  Cervical exam performed        EGBUS normal; vaginal vault with normal white d/c, no VB. Cx: nttp, visually closed. Cl/long/high  Extremities: Normal range of motion.  Edema: Trace  Mental Status: Normal mood and affect. Normal behavior. Normal judgment and thought content.   Urinalysis:       Assessment and Plan:  Pregnancy: G3P2002 at 5498w5d  1. Supervision of high risk pregnancy, antepartum Routine care. Pt told to let us know if VB happens again and go to MAU  2. Umbilical vein varix D/w her re: this again. Has u/s scheduled by MFM already. Delivery at 37wks  3. Excessive fetal growth affecting management of pregnancy in third trimester, single or unspecified fetus Normal 2hr GTT.   4. Language barrier Interpreter used   5. History of eclampsia Continue baby asa  Preterm labor symptoms and general obstetric precautions including but not limited to vaginal bleeding, contractions, leaking of fluid and fetal movement were reviewed in detail with the patient. Please refer to After Visit Summary for other counseling recommendations.  Return in about 2 weeks (around 06/02/2016).   Doyle BingPickens, [name] Hughley, MD

## 2016-05-21 ENCOUNTER — Encounter (HOSPITAL_COMMUNITY): Payer: Self-pay

## 2016-05-21 ENCOUNTER — Ambulatory Visit (HOSPITAL_COMMUNITY)
Admission: RE | Admit: 2016-05-21 | Discharge: 2016-05-21 | Disposition: A | Discharge status: Home / Self Care | Payer: Self-pay | Source: Ambulatory Visit | Attending: Obstetrics and Gynecology | Admitting: Obstetrics and Gynecology

## 2016-05-21 DIAGNOSIS — I868 Varicose veins of other specified sites: Secondary | ICD-10-CM | POA: Insufficient documentation

## 2016-05-21 DIAGNOSIS — O358XX0 Maternal care for other (suspected) fetal abnormality and damage, not applicable or unspecified: Secondary | ICD-10-CM | POA: Insufficient documentation

## 2016-05-21 DIAGNOSIS — O26893 Other specified pregnancy related conditions, third trimester: Secondary | ICD-10-CM | POA: Insufficient documentation

## 2016-05-21 DIAGNOSIS — Z3A34 34 weeks gestation of pregnancy: Secondary | ICD-10-CM | POA: Insufficient documentation

## 2016-05-22 ENCOUNTER — Encounter (HOSPITAL_COMMUNITY): Payer: Self-pay

## 2016-05-22 ENCOUNTER — Inpatient Hospital Stay (HOSPITAL_COMMUNITY)
Admission: AD | Admit: 2016-05-22 | Discharge: 2016-05-28 | DRG: 774 | Disposition: A | Discharge status: Home / Self Care | Payer: Medicaid Other | Source: Ambulatory Visit | Attending: Obstetrics & Gynecology | Admitting: Obstetrics & Gynecology

## 2016-05-22 ENCOUNTER — Inpatient Hospital Stay (HOSPITAL_COMMUNITY): Payer: Medicaid Other

## 2016-05-22 DIAGNOSIS — O3663X0 Maternal care for excessive fetal growth, third trimester, not applicable or unspecified: Secondary | ICD-10-CM | POA: Diagnosis present

## 2016-05-22 DIAGNOSIS — O280 Abnormal hematological finding on antenatal screening of mother: Secondary | ICD-10-CM

## 2016-05-22 DIAGNOSIS — Z3A34 34 weeks gestation of pregnancy: Secondary | ICD-10-CM | POA: Diagnosis not present

## 2016-05-22 DIAGNOSIS — O4693 Antepartum hemorrhage, unspecified, third trimester: Secondary | ICD-10-CM

## 2016-05-22 DIAGNOSIS — O283 Abnormal ultrasonic finding on antenatal screening of mother: Secondary | ICD-10-CM | POA: Diagnosis present

## 2016-05-22 DIAGNOSIS — Z7982 Long term (current) use of aspirin: Secondary | ICD-10-CM | POA: Diagnosis not present

## 2016-05-22 LAB — CBC
HCT: 37.6 % (ref 36.0–46.0)
Hemoglobin: 13 g/dL (ref 12.0–15.0)
MCH: 32.7 pg (ref 26.0–34.0)
MCHC: 34.6 g/dL (ref 30.0–36.0)
MCV: 94.5 fL (ref 78.0–100.0)
PLATELETS: 322 10*3/uL (ref 150–400)
RBC: 3.98 MIL/uL (ref 3.87–5.11)
RDW: 13.7 % (ref 11.5–15.5)
WBC: 12 10*3/uL — ABNORMAL HIGH (ref 4.0–10.5)

## 2016-05-22 LAB — TYPE AND SCREEN
ABO/RH(D): O POS
ANTIBODY SCREEN: NEGATIVE

## 2016-05-22 MED ORDER — SODIUM CHLORIDE 0.9% FLUSH: 3.0000 mL | INTRAVENOUS | Status: DC | PRN

## 2016-05-22 MED ORDER — SODIUM CHLORIDE 0.9 % IV SOLN: 250.0000 mL | INTRAVENOUS | Status: DC | PRN

## 2016-05-22 MED ORDER — PRENATAL MULTIVITAMIN CH
1.0000 | ORAL_TABLET | Freq: Every day | ORAL | Status: DC
  Administered 2016-05-23 – 2016-05-27 (×5): 1 via ORAL
  Filled 2016-05-22 (×5): qty 1

## 2016-05-22 MED ORDER — BETAMETHASONE SOD PHOS & ACET 6 (3-3) MG/ML IJ SUSP
12.0000 mg | INTRAMUSCULAR | Status: AC
  Administered 2016-05-22 – 2016-05-23 (×2): 12 mg via INTRAMUSCULAR
  Filled 2016-05-22 (×2): qty 2

## 2016-05-22 MED ORDER — ACETAMINOPHEN 325 MG PO TABS
650.0000 mg | ORAL_TABLET | ORAL | Status: DC | PRN
  Administered 2016-05-26: 650 mg via ORAL
  Filled 2016-05-22: qty 2

## 2016-05-22 MED ORDER — SODIUM CHLORIDE 0.9% FLUSH
3.0000 mL | Freq: Two times a day (BID) | INTRAVENOUS | Status: DC
  Administered 2016-05-22 – 2016-05-27 (×10): 3 mL via INTRAVENOUS

## 2016-05-22 MED ORDER — CALCIUM CARBONATE ANTACID 500 MG PO CHEW: 2.0000 | CHEWABLE_TABLET | ORAL | Status: DC | PRN

## 2016-05-22 MED ORDER — ZOLPIDEM TARTRATE 5 MG PO TABS: 5.0000 mg | ORAL_TABLET | Freq: Every evening | ORAL | Status: DC | PRN

## 2016-05-22 MED ORDER — DOCUSATE SODIUM 100 MG PO CAPS
100.0000 mg | ORAL_CAPSULE | Freq: Every day | ORAL | Status: DC
  Administered 2016-05-23 – 2016-05-26 (×4): 100 mg via ORAL
  Filled 2016-05-22 (×5): qty 1

## 2016-05-22 NOTE — MAU Note (Signed)
Urine in lab 

## 2016-05-22 NOTE — MAU Note (Signed)
About 30 min ago, got up from the table and a lot of blood came out.  Denies hx of previa.  Little bit of cramping, started this morningi.

## 2016-05-22 NOTE — MAU Note (Signed)
Pt. Transferred to Hi Risk Unit for continued care via WC.  Report given to charge nurse on the unit.

## 2016-05-22 NOTE — H&P (Signed)
ANTEPARTUM ADMISSION HISTORY AND PHYSICAL NOTE   History of Present Illness: [name] Hull is a 36 y.o. G3P2002 at [redacted]w[redacted]d admitted for sinus bleeding.   Patient reports the fetal movement as active. Patient reports uterine contraction  activity as none. Patient reports  vaginal bleeding as flow about like a period. Patient describes fluid per vagina as None. Fetal presentation is unsure.  Patient Active Problem List   Diagnosis Date Noted  . Vaginal bleeding in pregnancy, third trimester 05/22/2016  . LGA (large for gestational age) fetus affecting management of mother 05/14/2016  . 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 Language barrier 04/30/2016  . History of eclampsia 02/17/2016  . Encounter for supervision of high risk multigravida of advanced maternal age, antepartum 02/17/2016  . Abnormal maternal serum screening test 01/30/2016  . Advanced maternal age in multigravida, second trimester     Past Medical History:  Diagnosis Date  . Anxiety   . Depression   . Foot fracture, left    as child  . Gestational diabetes   . Hypertension   . Pregnancy induced hypertension   . Seizure disorder during pregnancy [facility]) 2012    Past Surgical History:  Procedure Laterality Date  . NO PAST SURGERIES      OB History  Gravida Para Term Preterm AB Living  3 2 2     2   SAB TAB Ectopic Multiple Live Births          2    # Outcome Date GA Lbr Len/2nd Weight Sex Delivery Anes PTL Lv  3 Current           2 Term 05/14/14 [redacted]w[redacted]d  3.629 kg (8 lb) F Vag-Spont EPI N LIV     Complications: Gestational diabetes,PIH (pregnancy induced hypertension)  1 Term 11/10/10 [redacted]w[redacted]d  3.629 kg (8 lb) F Vag-Spont EPI N LIV     Complications: Gestational diabetes      Social History   Social History  . Marital status: Single    Spouse name: N/A  . Number of children: N/A  . Years of education: N/A   Social History Main Topics  . Smoking status: Never Smoker  . Smokeless tobacco: Never  Used  . Alcohol use No  . Drug use: No  . Sexual activity: Yes    Birth control/ protection: None   Other Topics Concern  . None   Social History Narrative  . None    Family History  Problem Relation Age of Onset  . Hypertension Mother   . Diabetes Mother     No Known Allergies  Prescriptions Prior to Admission  Medication Sig Dispense Refill Last Dose  . aspirin 81 MG tablet Take 1 tablet (81 mg total) by mouth daily. 30 tablet 6 05/22/2016 at Unknown time  . loratadine (CLARITIN) 10 MG tablet Take 10 mg by mouth daily as needed.    05/22/2016 at Unknown time  . Prenatal Vit-Fe Fumarate-FA (PRENATAL VITAMIN PO) Take 1 tablet by mouth daily.    05/22/2016 at Unknown time  . acetaminophen (TYLENOL) 325 MG tablet Take 650 mg by mouth every 6 (six) hours as needed for moderate pain.    05/13/2016    Review of Systems - General ROS: negative  Vitals:  BP 128/66 (BP Location: Right Arm)   Pulse 100   Temp 99 F (37.2 C) (Oral)   Resp 18   Wt 90.4 kg (199 lb 4 oz)   LMP 09/26/2015   SpO2 99%  BMI 34.20 kg/m  Physical Examination: CONSTITUTIONAL: Well-developed, well-nourished female in no acute distress.  HENT:  Normocephalic, atraumatic, External right and left ear normal. Oropharynx is clear and moist EYES: Conjunctivae and EOM are normal. Pupils are equal, round, and reactive to light. No scleral icterus.  NECK: Normal range of motion, supple, no masses SKIN: Skin is warm and dry. No rash noted. Not diaphoretic. No erythema. No pallor. NEUROLGIC: Alert and oriented to person, place, and time. Normal reflexes, muscle tone coordination. No cranial nerve deficit noted. PSYCHIATRIC: Normal mood and affect. Normal behavior. Normal judgment and thought content. CARDIOVASCULAR: Normal heart rate noted, regular rhythm RESPIRATORY: Effort and breath sounds normal, no problems with respiration noted ABDOMEN: Soft, nontender, nondistended, gravid. MUSCULOSKELETAL: Normal range of  motion. No edema and no tenderness. 2+ distal pulses.  Cervix: Evaluated by sterile speculum exam. and found to be <0.5 cm/ 25%/Floating and fetal presentation is unsure. Membranes:intact Fetal Monitoring:Baseline: 145 bpm Tocometer: irregular, infrequent that do not cause her pain  Labs:  No results found for this or any previous visit (from the past 24 hour(s)).  Imaging Studies: [place] Mfm Fetal Bpp Wo Non Stress  Result Date: 05/21/2016 ----------------------------------------------------------------------  OBSTETRICS REPORT                      (Signed Final 05/21/2016 11:12 am) ---------------------------------------------------------------------- Patient Info  ID #:       [id number]                         D.O.B.:   17-Aug-1980 (36 yrs)  Name:       [name] Hull                   Visit Date:  05/21/2016 10:32 am              HERNANDEZ ---------------------------------------------------------------------- Performed By  Performed By:     Jodean Lima          Ref. Address:     [facility]                    RDMS                                                             OB/Gyn Clinic                                                             [address]                                                             [address]  [place], [place]                                                             [address]  Attending:        Charlsie Merles MD         Location:         [place]  Referred By:      [facility] for                    [facility]                    Healthcare ---------------------------------------------------------------------- Orders   #  Description                                 Code   1  [place] MFM FETAL BPP WO NON STRESS              60454.09  ----------------------------------------------------------------------   #  Ordered By               Order #        Accession #    Episode #   1  Darlyn Read               [id number]      [id number]     [id number]  ---------------------------------------------------------------------- Indications   [redacted] weeks gestation of pregnancy                Z3A.23   Advanced maternal age multigravida 7+,        O65.521   first trimester; high risk quad: 1 in 20 for   T21; low risk NIPS   Poor obstetric history: Previous gestational   O09.299   diabetes   Poor obstetric history: Previous               O09.299   preeclampsia / eclampsia/gestational HTN   Abnormal biochemical screen (quad screen:      O28.9   DSR 1 in 184)   Umbilical vein abnormality complicating        O69.9XX0   pregnancy   Decreased fetal movements, third trimester,    O36.8130   unspecified  ---------------------------------------------------------------------- OB History  Gravidity:    3         Term:   2        Prem:   0        SAB:   0  TOP:          0       Ectopic:  0        Living: 2 ---------------------------------------------------------------------- Fetal Evaluation  Num Of Fetuses:     1  Fetal Heart         145  Rate(bpm):  Cardiac Activity:   Observed  Presentation:       Cephalic  Placenta:           Posterior, above cervical os  P. Cord Insertion:  Previously Visualized  Amniotic Fluid  AFI FV:      Subjectively within normal  limits  AFI Sum(cm)     %Tile       Largest Pocket(cm)  16.33           59          7.88  RUQ(cm)       RLQ(cm)       LUQ(cm)        LLQ(cm)  7.88          4.99          1.68           1.78 ---------------------------------------------------------------------- Biophysical Evaluation  Amniotic F.V:   Within normal limits       F. Tone:        Observed  F. Movement:    Observed                   Score:          8/8  F. Breathing:   Observed ---------------------------------------------------------------------- Gestational Age  LMP:           34w 0d       Date:   09/26/15                 EDD:   07/02/16  Best:          34w 0d    Det. By:   LMP  (09/26/15)          EDD:   07/02/16  ---------------------------------------------------------------------- Impression  Single living intrauterine pregnancy at 34+4 weeks with  umbilical vein varix, AMA and abnormal maternal serum  marker screen; here for BPP.  Normal amniotic fluid volume.  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 There is no evidence of  a filling defect.  BPP 8/8. ---------------------------------------------------------------------- Recommendations  Follow up evaluation twice weekly as scheduled ----------------------------------------------------------------------                 Charlsie Merles, MD Electronically Signed Final Report   05/21/2016 11:12 am ----------------------------------------------------------------------  [place] Mfm Fetal Bpp Wo Non Stress  Result Date: 05/18/2016 ----------------------------------------------------------------------  OBSTETRICS REPORT                      (Signed Final 05/18/2016 09:58 am) ---------------------------------------------------------------------- Patient Info  ID #:       [id number]                         D.O.B.:   1980-08-12 (36 yrs)  Name:       [name] Hull                   Visit Date:  05/18/2016 09:46 am              HERNANDEZ ---------------------------------------------------------------------- Performed By  Performed By:     Jodean Lima          Ref. Address:     [facility]                    RDMS                                                             OB/Gyn Clinic  [address]                                                             [place], [place]                                                             [address]  Attending:        Charlsie Merles MD         Location:         [facility]  Referred By:      [facility] for                    [facility]                    Healthcare  ---------------------------------------------------------------------- Orders   #  Description                                 Code   1  [place] MFM FETAL BPP WO NON STRESS              19147.82  ----------------------------------------------------------------------   #  Ordered By               Order #        Accession #    Episode #   1  Darlyn Read              [id number]      [id number]     [id number]  ---------------------------------------------------------------------- Indications   [redacted] weeks gestation of pregnancy                Z3A.55   Advanced maternal age multigravida 45+,        O22.521   first trimester; high risk quad: 1 in 48 for   T21; low risk NIPS   Poor obstetric history: Previous gestational   O09.299   diabetes   Poor obstetric history: Previous               O09.299   preeclampsia / eclampsia/gestational HTN   Abnormal biochemical screen (quad screen:      O28.9   DSR 1 in 184)   Umbilical vein abnormality complicating        O69.9XX0   pregnancy  ---------------------------------------------------------------------- OB History  Gravidity:    3         Term:   2        Prem:   0        SAB:  0  TOP:          0       Ectopic:  0        Living: 2 ---------------------------------------------------------------------- Fetal Evaluation  Num Of Fetuses:     1  Fetal Heart         151  Rate(bpm):  Cardiac Activity:   Observed  Presentation:       Cephalic  Placenta:           Posterior, above cervical os  P. Cord Insertion:  Previously Visualized  Amniotic Fluid  AFI FV:      Subjectively upper-normal  AFI Sum(cm)     %Tile       Largest Pocket(cm)  19.87           74          9.55  RUQ(cm)       RLQ(cm)       LUQ(cm)        LLQ(cm)  9.55          4.78          3.28           2.26 ---------------------------------------------------------------------- Biophysical Evaluation  Amniotic F.V:   Pocket => 2 cm two         F. Tone:        Observed                  planes  F. Movement:    Observed                    Score:          8/8  F. Breathing:   Observed ---------------------------------------------------------------------- Gestational Age  LMP:           33w 4d       Date:   09/26/15                 EDD:   07/02/16  Best:          33w 4d    Det. By:   LMP  (09/26/15)          EDD:   07/02/16 ---------------------------------------------------------------------- Impression  Single living intrauterine pregnancy at 33+4 weeks with  umbilical vein varix, AMA and abnormal maternal serum  marker screen; here for BPP.  Normal amniotic fluid volume.  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 There is no evidence of  a filling defect.  BPP 8/8. ---------------------------------------------------------------------- Recommendations  Follow up evaluation twice weekly as scheduled ----------------------------------------------------------------------                 Charlsie Merles, MD Electronically Signed Final Report   05/18/2016 09:58 am ----------------------------------------------------------------------  [place] Mfm Fetal Bpp Wo Non Stress  Result Date: 05/14/2016 ----------------------------------------------------------------------  OBSTETRICS REPORT                      (Signed Final 05/14/2016 11:25 am) ---------------------------------------------------------------------- Patient Info  ID #:       [id number]                         D.O.B.:   Mar 15, 1980 (36 yrs)  Name:       [name] Hull                   Visit Date:  05/14/2016 10:26 am  HERNANDEZ ---------------------------------------------------------------------- Performed By  Performed By:     Eden Lathe BS      Ref. Address:     [facility]                    RDMS RVT                                                             OB/Gyn Clinic                                                             [address]                                                             [place],  [place]                                                             [address]  Attending:        Darlyn Read MD         Location:         [facility]  Referred By:      [facility] for                    [facility]                    Healthcare ---------------------------------------------------------------------- Orders   #  Description                                 Code   1  [place] MFM OB FOLLOW UP                         27253.66   2  [place] MFM FETAL BPP WO NON STRESS              44034.74  ----------------------------------------------------------------------   #  Ordered By               Order #        Accession #    Episode #   1  CHARLIE PICKENS          [id number]      [id number]     [id number]   2  EMILY BUNCE              [id number]      [id number]     [id number]  ---------------------------------------------------------------------- Indications   [redacted] weeks gestation of pregnancy                Z3A.38   Advanced maternal age multigravida 5+,        O2.521   first trimester; high risk quad: 1 in 18 for   T21; low risk NIPS   Poor obstetric history: Previous gestational   O09.299   diabetes   Poor obstetric history: Previous               O09.299   preeclampsia / eclampsia/gestational HTN   Abnormal biochemical screen (quad screen:      O28.9   DSR 1 in 184)   Umbilical vein abnormality complicating        O69.9XX0   pregnancy  ---------------------------------------------------------------------- OB History  Gravidity:    3         Term:   2        Prem:   0        SAB:   0  TOP:          0       Ectopic:  0        Living: 2 ---------------------------------------------------------------------- Fetal Evaluation  Num Of Fetuses:     1  Fetal Heart         128  Rate(bpm):  Cardiac Activity:   Observed  Presentation:       Cephalic  Placenta:           Posterior Fundal, above cervical os  P. Cord Insertion:  Visualized, central  Amniotic Fluid  AFI FV:      Subjectively within normal limits   AFI Sum(cm)     %Tile       Largest Pocket(cm)  17.53           64          4.86  RUQ(cm)       RLQ(cm)       LUQ(cm)        LLQ(cm)  4.1           4.31          4.86           4.26 ---------------------------------------------------------------------- Biophysical Evaluation  Amniotic F.V:   Within normal limits       F. Tone:        Observed  F. Movement:    Observed                   Score:          8/8  F. Breathing:   Observed ---------------------------------------------------------------------- Biometry  BPD:      87.4  mm     G. Age:  35w 2d         95  %    CI:        68.38   %   70 - 86  FL/HC:      18.9   %   19.9 - 21.5  HC:      337.9  mm     G. Age:  38w 5d       > 97  %    HC/AC:      1.05       0.96 - 1.11  AC:       323   mm     G. Age:  36w 1d       > 97  %    FL/BPD:     72.9   %   71 - 87  FL:       63.7  mm     G. Age:  33w 0d         36  %    FL/AC:      19.7   %   20 - 24  HUM:        57  mm     G. Age:  33w 1d         61  %  Est. FW:    2702  gm    5 lb 15 oz    > 90  % ---------------------------------------------------------------------- Gestational Age  LMP:           33w 0d       Date:   09/26/15                 EDD:   07/02/16  U/S Today:     35w 6d                                        EDD:   06/12/16  Best:          33w 0d    Det. By:   LMP  (09/26/15)          EDD:   07/02/16 ---------------------------------------------------------------------- Anatomy  Cranium:               Appears normal         Aortic Arch:            Previously seen  Cavum:                 Appears normal         Ductal Arch:            Previously seen  Ventricles:            Appears normal         Diaphragm:              Appears normal  Choroid Plexus:        Previously seen        Stomach:                Appears normal, left                                                                        sided  Cerebellum:            Appears normal  Abdomen:                 Umbilical vein                                                                        varix  Posterior Fossa:       Appears normal         Abdominal Wall:         Previously seen  Nuchal Fold:           Previously seen        Cord Vessels:           Appears normal (3                                                                        vessel cord)  Face:                  Orbits and profile     Kidneys:                Appear normal                         previously seen  Lips:                  Previously seen        Bladder:                Appears normal  Thoracic:              Appears normal         Spine:                  Previously seen  Heart:                 Appears normal         Upper Extremities:      Previously seen                         (4CH, axis, and situs  RVOT:                  Appears normal         Lower Extremities:      Previously seen  LVOT:                  Appears normal  Other:  Appears to be female  Heels previously visualized. ---------------------------------------------------------------------- Cervix Uterus Adnexa  Cervix  Not visualized (advanced GA >29wks)  Uterus  No abnormality visualized.  Left Ovary  Not visualized.  Right Ovary  Not visualized.  Cul De Sac:   No free fluid seen.  Adnexa:       No abnormality visualized. ---------------------------------------------------------------------- Impression  Single living intrauterine pregnancy at [redacted]w[redacted]d.  Cephalic presentation.  Estimated fetal weight large for gestational age >  90%ile.  AC>97%ile.  Normal amniotic fluid volume.  An umbilical vein varix measuring 1.2cm is noted. There is no  evidence of a filling defect.  Otherwise normal interval fetal anatomy.  BPP 8/8. ---------------------------------------------------------------------- Recommendations  Findings of UVV reviewed with Spanish interpreter.  Recommend twice weekly BPPs with UV dopplers.  Delivery by 37 weeks if fetal testing remains reassuring.  ----------------------------------------------------------------------                   Darlyn Read, MD Electronically Signed Final Report   05/14/2016 11:25 am ----------------------------------------------------------------------  [place] Mfm Ob Follow Up  Result Date: 05/14/2016 ----------------------------------------------------------------------  OBSTETRICS REPORT                      (Signed Final 05/14/2016 11:25 am) ---------------------------------------------------------------------- Patient Info  ID #:       [id number]                         D.O.B.:   03-Sep-1980 (36 yrs)  Name:       [name] Hull                   Visit Date:  05/14/2016 10:26 am              HERNANDEZ ---------------------------------------------------------------------- Performed By  Performed By:     Eden Lathe BS      Ref. Address:     [facility]                    RDMS RVT                                                             OB/Gyn Clinic                                                             [address]                                                             [place], [place]                                                             [address]  Attending:        Darlyn Read MD  [facility]  Referred By:      [facility] for                    [facility]                    Healthcare ---------------------------------------------------------------------- Orders   #  Description                                 Code   1  [place] MFM OB FOLLOW UP                         [id number]   2  [place] MFM FETAL BPP WO NON STRESS              76819.01  ----------------------------------------------------------------------   #  Ordered By               Order #        Accession #    Episode #   1  CHARLIE PICKENS          [id number]      [id number]     [id number]   2  EMILY BUNCE              [id number]      [id number]      [id number]  ---------------------------------------------------------------------- Indications   [redacted] weeks gestation of pregnancy                Z3A.13   Advanced maternal age multigravida 26+,        O66.521   first trimester; high risk quad: 1 in 69 for   T21; low risk NIPS   Poor obstetric history: Previous gestational   O09.299   diabetes   Poor obstetric history: Previous               O09.299   preeclampsia / eclampsia/gestational HTN   Abnormal biochemical screen (quad screen:      O28.9   DSR 1 in 184)   Umbilical vein abnormality complicating        O69.9XX0   pregnancy  ---------------------------------------------------------------------- OB History  Gravidity:    3         Term:   2        Prem:   0        SAB:   0  TOP:          0       Ectopic:  0        Living: 2 ---------------------------------------------------------------------- Fetal Evaluation  Num Of Fetuses:     1  Fetal Heart         128  Rate(bpm):  Cardiac Activity:   Observed  Presentation:       Cephalic  Placenta:           Posterior Fundal, above cervical os  P. Cord Insertion:  Visualized, central  Amniotic Fluid  AFI FV:      Subjectively within normal limits  AFI Sum(cm)     %Tile       Largest Pocket(cm)  17.53           64  4.86  RUQ(cm)       RLQ(cm)       LUQ(cm)        LLQ(cm)  4.1           4.31          4.86           4.26 ---------------------------------------------------------------------- Biophysical Evaluation  Amniotic F.V:   Within normal limits       F. Tone:        Observed  F. Movement:    Observed                   Score:          8/8  F. Breathing:   Observed ---------------------------------------------------------------------- Biometry  BPD:      87.4  mm     G. Age:  35w 2d         95  %    CI:        68.38   %   70 - 86                                                          FL/HC:      18.9   %   19.9 - 21.5  HC:      337.9  mm     G. Age:  38w 5d       > 97  %    HC/AC:      1.05       0.96 - 1.11   AC:       323   mm     G. Age:  36w 1d       > 97  %    FL/BPD:     72.9   %   71 - 87  FL:       63.7  mm     G. Age:  33w 0d         36  %    FL/AC:      19.7   %   20 - 24  HUM:        57  mm     G. Age:  33w 1d         61  %  Est. FW:    2702  gm    5 lb 15 oz    > 90  % ---------------------------------------------------------------------- Gestational Age  LMP:           33w 0d       Date:   09/26/15                 EDD:   07/02/16  U/S Today:     35w 6d                                        EDD:   06/12/16  Best:          33w 0d    Det. By:   LMP  (09/26/15)          EDD:   07/02/16 ---------------------------------------------------------------------- Anatomy  Cranium:  Appears normal         Aortic Arch:            Previously seen  Cavum:                 Appears normal         Ductal Arch:            Previously seen  Ventricles:            Appears normal         Diaphragm:              Appears normal  Choroid Plexus:        Previously seen        Stomach:                Appears normal, left                                                                        sided  Cerebellum:            Appears normal         Abdomen:                Umbilical vein                                                                        varix  Posterior Fossa:       Appears normal         Abdominal Wall:         Previously seen  Nuchal Fold:           Previously seen        Cord Vessels:           Appears normal (3                                                                        vessel cord)  Face:                  Orbits and profile     Kidneys:                Appear normal                         previously seen  Lips:                  Previously seen        Bladder:                Appears normal  Thoracic:  Appears normal         Spine:                  Previously seen  Heart:                 Appears normal         Upper Extremities:      Previously seen                         (4CH, axis,  and situs  RVOT:                  Appears normal         Lower Extremities:      Previously seen  LVOT:                  Appears normal  Other:  Appears to be female  Heels previously visualized. ---------------------------------------------------------------------- Cervix Uterus Adnexa  Cervix  Not visualized (advanced GA >29wks)  Uterus  No abnormality visualized.  Left Ovary  Not visualized.  Right Ovary  Not visualized.  Cul De Sac:   No free fluid seen.  Adnexa:       No abnormality visualized. ---------------------------------------------------------------------- Impression  Single living intrauterine pregnancy at [redacted]w[redacted]d.  Cephalic presentation.  Estimated fetal weight large for gestational age > 90%ile.  AC>97%ile.  Normal amniotic fluid volume.  An umbilical vein varix measuring 1.2cm is noted. There is no  evidence of a filling defect.  Otherwise normal interval fetal anatomy.  BPP 8/8. ---------------------------------------------------------------------- Recommendations  Findings of UVV reviewed with Spanish interpreter.  Recommend twice weekly BPPs with UV dopplers.  Delivery by 37 weeks if fetal testing remains reassuring. ----------------------------------------------------------------------                   Darlyn Read, MD Electronically Signed Final Report   05/14/2016 11:25 am ----------------------------------------------------------------------  [place] Mfm Ob Limited  Result Date: 05/06/2016 ----------------------------------------------------------------------  OBSTETRICS REPORT                      (Signed Final 05/06/2016 10:06 pm) ---------------------------------------------------------------------- Patient Info  ID #:       [id number]                         D.O.B.:   1980/09/28 (36 yrs)  Name:       [name] Hull                   Visit Date:  05/05/2016 10:57 pm              HERNANDEZ ---------------------------------------------------------------------- Performed By  Performed By:     Ellin Saba        Ref. Address:     [facility]                    RDMS                                                             Health  Attending:        Particia Nearing MD       [facility]  Referred By:      Felipe Drone ---------------------------------------------------------------------- Orders   #  Description                                 Code   1  [place] MFM OB LIMITED                           [id number]  ----------------------------------------------------------------------   #  Ordered By               Order #        Accession #    Episode #   1  Judeth Horn            [id number]      [id number]     [id number]  ---------------------------------------------------------------------- Indications   [redacted] weeks gestation of pregnancy                Z3A.31   Vaginal bleeding in pregnancy, third trimester O84.93   Advanced maternal age multigravida 52+,        O59.521   first trimester; high risk quad: 1 in 28 for   T21; low risk NIPS   Poor obstetric history: Previous gestational   O09.299   diabetes   Poor obstetric history: Previous               O09.299   preeclampsia / eclampsia/gestational HTN   Abnormal biochemical screen (quad screen:      O28.9   DSR 1 in 184)  ---------------------------------------------------------------------- OB History  Gravidity:    3         Term:   2        Prem:   0        SAB:   0  TOP:          0       Ectopic:  0        Living: 2 ---------------------------------------------------------------------- Fetal Evaluation  Num Of Fetuses:     1  Fetal Heart         162  Rate(bpm):  Cardiac Activity:   Observed  Presentation:       Transverse, head to maternal right  Placenta:           Posterior Fundal, above cervical os  Amniotic Fluid  AFI FV:      Subjectively within normal limits  AFI Sum(cm)     %Tile       Largest Pocket(cm)  17.15           63          6.84  RUQ(cm)       RLQ(cm)       LUQ(cm)        LLQ(cm)  6.84          3.31           2.36           4.64  Comment:    No placental abruption or previa identified. ---------------------------------------------------------------------- Gestational Age  LMP:           31w 5d       Date:   09/26/15  EDD:   07/02/16  Best:          31w 5d    Det. By:   LMP  (09/26/15)          EDD:   07/02/16 ---------------------------------------------------------------------- Cervix Uterus Adnexa  Cervix  Length:            4.4  cm.  Normal appearance by transabdominal scan.  Left Ovary  Previously seen.  Right Ovary  Previously seen ---------------------------------------------------------------------- Impression  SIUP at 31+5 weeks  Normal amniotic fluid volume  Posterior/fundal placenta; no previa; no subchorionic fluid  collections/hemorrhage identified ---------------------------------------------------------------------- Recommendations  Follow-up as clinically indicated ----------------------------------------------------------------------                 Particia Nearing, MD Electronically Signed Final Report   05/06/2016 10:06 pm ----------------------------------------------------------------------    Assessment and Plan: Patient Active Problem List   Diagnosis Date Noted  . Vaginal bleeding in pregnancy, third trimester 05/22/2016  . LGA (large for gestational age) fetus affecting management of mother 05/14/2016  . 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 Language barrier 04/30/2016  . History of eclampsia 02/17/2016  . Encounter for supervision of high risk multigravida of advanced maternal age, antepartum 02/17/2016  . Abnormal maternal serum screening test 01/30/2016  . Advanced maternal age in multigravida, second trimester    Admit to Antenatal Routine antenatal care Plan: Administer betamethasone X 2 -GBS, GC CT cultures -D/C aspirin  Marylene Land, CNM  Faculty Practice, [facility]

## 2016-05-22 NOTE — Progress Notes (Addendum)
G3p2 @ 34.[redacted] wksga. Presents to triage for big gush of bleeding 30 -40  mins ago when getting up from the table. Denies placenta previa. Cramping all day that started this morning. +FM  Hx of pretern delivery for PIH. SVD x2  Last week on Monday MD informed pt has a clot on the umbilical cord hand been seenby MFM on Monday. She sees MFM twice a week for this issue.   EFM applied  VSS see flow sheet for details.   Pad between legs. Scant amount of blood noted on pad. Abdomen soft to palpitation.   1648: Provider at bs assessing pt.   1653: speculum exam done blood clots noted in vaginal vault  SVE: closed. Copious amt of blood noted on gloves.   1758: pt to U/S via wheel chair taken by Nurse tech  [phone]: pt back from U/S.  EFM resumed.  1918:  Reported off to RN Drinda ButtsAnnette, [employer]

## 2016-05-23 DIAGNOSIS — O4693 Antepartum hemorrhage, unspecified, third trimester: Secondary | ICD-10-CM

## 2016-05-23 DIAGNOSIS — Z3A34 34 weeks gestation of pregnancy: Secondary | ICD-10-CM

## 2016-05-23 LAB — ABO/RH: ABO/RH(D): O POS

## 2016-05-23 LAB — RPR: RPR: NONREACTIVE

## 2016-05-23 NOTE — Progress Notes (Signed)
Patient ID: [name] Hull, female   DOB: [birth year], 36 y.o.   MRN: [id number] FACULTY PRACTICE ANTEPARTUM(COMPREHENSIVE) NOTE  Sophina Mitten is a 36 y.o. [id number] with Estimated Date of Delivery: 07/02/16   By  early ultrasound [redacted]w[redacted]d  who is admitted for 3rd trimester bleeding.    Fetal presentation is cephalic. Length of Stay:  1  Days  Date of admission:05/22/2016  Subjective: Pt reports she is no longer having bleeding Patient reports the fetal movement as active. Patient reports uterine contraction  activity as none. Patient reports  vaginal bleeding as scant staining. Patient describes fluid per vagina as None.  Vitals:  Blood pressure (!) 118/52, pulse 92, temperature 97.8 F (36.6 C), temperature source Oral, resp. rate 16, height 5\' 4"  (1.626 m), weight 199 lb (90.3 kg), last menstrual period 09/26/2015, SpO2 98 %. Vitals:   05/23/16 0037 05/23/16 0046 05/23/16 0522 05/23/16 0848  BP: (!) 122/53 (!) 121/52 (!) 121/44 (!) 118/52  Pulse: 92  96 92  Resp: 16  16 16   Temp: 98.6 F (37 C)  97.9 F (36.6 C) 97.8 F (36.6 C)  TempSrc: Oral  Oral Oral  SpO2: 98% 96% 98% 98%  Weight:      Height:       Physical Examination:  General appearance - alert, well appearing, and in no distress Abdomen - soft, nontender,  Fundal Height:  size equals dates  Extremities: extremities normal, atraumatic, no cyanosis or edema with DTRs 2+ bilaterally Membranes:intact  Fetal Monitoring:     Reactive NST  Labs:  Results for orders placed or performed during the hospital encounter of 05/22/16 (from the past 24 hour(s))  Culture, beta strep (group b only)   Collection Time: 05/22/16  8:18 PM  Result Value Ref Range   Specimen Description VAGINAL/RECTAL    Special Requests NONE    Culture      CULTURE REINCUBATED FOR BETTER GROWTH Performed at [facility], [address] [place], [place] [address]    Report Status PENDING   CBC on admission   Collection Time:  05/22/16  8:30 PM  Result Value Ref Range   WBC 12.0 (H) 4.0 - 10.5 K/uL   RBC 3.98 3.87 - 5.11 MIL/uL   Hemoglobin 13.0 12.0 - 15.0 g/dL   HCT 36.6 44.0 - 34.7 %   MCV 94.5 78.0 - 100.0 fL   MCH 32.7 26.0 - 34.0 pg   MCHC 34.6 30.0 - 36.0 g/dL   RDW 42.5 95.6 - 38.7 %   Platelets 322 150 - 400 K/uL  Type and screen [facility]   Collection Time: 05/22/16  8:30 PM  Result Value Ref Range   ABO/RH(D) O POS    Antibody Screen NEG    Sample Expiration 05/25/2016   ABO/Rh   Collection Time: 05/22/16  8:30 PM  Result Value Ref Range   ABO/RH(D) O POS     Imaging Studies:      Medications:  Scheduled . betamethasone acetate-betamethasone sodium phosphate  12 mg Intramuscular Q24H  . docusate sodium  100 mg Oral Daily  . prenatal multivitamin  1 tablet Oral Q1200  . sodium chloride flush  3 mL Intravenous Q12H   I have reviewed the patient's current medications.  ASSESSMENT: [id number] [redacted]w[redacted]d Estimated Date of Delivery: 07/02/16  Marginal sinus abruption/bleeding  Patient Active Problem List   Diagnosis Date Noted  . Vaginal bleeding in pregnancy, third trimester 05/22/2016  . LGA (large for gestational  age) fetus affecting management of mother 05/14/2016  . 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 Language barrier 04/30/2016  . History of eclampsia 02/17/2016  . Encounter for supervision of high risk multigravida of advanced maternal age, antepartum 02/17/2016  . Abnormal maternal serum screening test 01/30/2016  . Advanced maternal age in multigravida, second trimester     PLAN: In house observation, duration is dependent on bleeding, 7 days minimum Betamethasone has already been given Stop aspirin  Fredi Geiler H 05/23/2016,10:35 AM

## 2016-05-24 LAB — CULTURE, BETA STREP (GROUP B ONLY)

## 2016-05-24 LAB — OB RESULTS CONSOLE GBS: GBS: NEGATIVE

## 2016-05-24 NOTE — Progress Notes (Signed)
Patient ID: [name] Hull, female   DOB: 04/29/1980, 36 y.o.   MRN: [id number] ACULTY PRACTICE ANTEPARTUM COMPREHENSIVE PROGRESS NOTE  [name] Hull is a 36 y.o. G3P2002 at 6037w3d  who is admitted for third trimester vaginal bleeding.   Fetal presentation is cephalic. Length of Stay:  2  Days  Subjective: Pt without complaints this morning.  Patient reports good fetal movement.  She reports no uterine contractions, no bleeding and no loss of fluid per vagina.  Vitals:  Blood pressure (!) 125/57, pulse (!) 102, temperature 98.3 F (36.8 C), temperature source Oral, resp. rate 18, height 5\' 4"  (1.626 m), weight 90.3 kg (199 lb), last menstrual period 09/26/2015, SpO2 98 %.   Physical Examination: Lungs clear  Heart RRR Abd soft + BS gravid non tender Ext non tender Membranes:intact  Fetal Monitoring:  Reactive NST  Labs:  No results found for this or any previous visit (from the past 24 hour(s)).  Imaging Studies:    none   Medications:  Scheduled . docusate sodium  100 mg Oral Daily  . prenatal multivitamin  1 tablet Oral Q1200  . sodium chloride flush  3 mL Intravenous Q12H   I have reviewed the patient's current medications.  ASSESSMENT: Patient Active Problem List   Diagnosis Date Noted  . Vaginal bleeding in pregnancy, third trimester 05/22/2016  . LGA (large for gestational age) fetus affecting management of mother 05/14/2016  . 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 Language barrier 04/30/2016  . History of eclampsia 02/17/2016  . Encounter for supervision of high risk multigravida of advanced maternal age, antepartum 02/17/2016  . Abnormal maternal serum screening test 01/30/2016  . Advanced maternal age in multigravida, second trimester     PLAN: Stable. No bleeding. S/P BMZ. Continue with 7 days of observation Continue routine antenatal care.   Hermina StaggersMichael L Gervis Gaba 05/24/2016,7:33 AM

## 2016-05-25 ENCOUNTER — Ambulatory Visit (HOSPITAL_COMMUNITY)
Admission: RE | Admit: 2016-05-25 | Discharge: 2016-05-25 | Disposition: A | Discharge status: Home / Self Care | Payer: Self-pay | Source: Ambulatory Visit | Attending: Obstetrics and Gynecology | Admitting: Obstetrics and Gynecology

## 2016-05-25 DIAGNOSIS — Z3A34 34 weeks gestation of pregnancy: Secondary | ICD-10-CM

## 2016-05-25 DIAGNOSIS — O4693 Antepartum hemorrhage, unspecified, third trimester: Principal | ICD-10-CM

## 2016-05-25 DIAGNOSIS — O358XX0 Maternal care for other (suspected) fetal abnormality and damage, not applicable or unspecified: Secondary | ICD-10-CM | POA: Insufficient documentation

## 2016-05-25 MED ORDER — HYDROXYZINE HCL 25 MG PO TABS
25.0000 mg | ORAL_TABLET | Freq: Four times a day (QID) | ORAL | Status: DC | PRN
  Administered 2016-05-25: 25 mg via ORAL
  Filled 2016-05-25 (×2): qty 2

## 2016-05-25 NOTE — Progress Notes (Signed)
Ordered her meals, check on patients needs, by Orlan LeavensViria Alvarez Spanish Interpreter.

## 2016-05-25 NOTE — Progress Notes (Signed)
Pt denies pain with ctxs.

## 2016-05-25 NOTE — Progress Notes (Signed)
Patient ID: [name] Hull, female   DOB: 08/04/1980, 36 y.o.   MRN: [id number] ACULTY PRACTICE ANTEPARTUM COMPREHENSIVE PROGRESS NOTE  [name] Hull is a 36 y.o. G3P2002 at 8837w4d  who is admitted for third trimester vaginal bleeding.   Fetal presentation is cephalic. Length of Stay:  3  Days  Subjective: No complaints this morning.  Patient is Spanish-speaking only, Spanish interpreter present for this encounter.  Patient reports good fetal movement.  She reports no uterine contractions, no bleeding and no loss of fluid per vagina.  Vitals:  Blood pressure (!) 113/55, pulse 84, temperature 98.4 F (36.9 C), temperature source Oral, resp. rate 20, height 5\' 4"  (1.626 m), weight 199 lb (90.3 kg), last menstrual period 09/26/2015, SpO2 100 %.  Physical Examination: Lungs clear  Heart RRR Abd soft + BS gravid non tender Ext non tender Membranes:intact  Fetal Monitoring:  Reactive NST  Labs:  No results found for this or any previous visit (from the past 24 hour(s)).  Imaging Studies:    none   Medications:  Scheduled . docusate sodium  100 mg Oral Daily  . prenatal multivitamin  1 tablet Oral Q1200  . sodium chloride flush  3 mL Intravenous Q12H   I have reviewed the patient's current medications.  ASSESSMENT: Patient Active Problem List   Diagnosis Date Noted  . Vaginal bleeding in pregnancy, third trimester 05/22/2016  . LGA (large for gestational age) fetus affecting management of mother 05/14/2016  . 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 Language barrier 04/30/2016  . History of eclampsia 02/17/2016  . Encounter for supervision of high risk multigravida of advanced maternal age, antepartum 02/17/2016  . Abnormal maternal serum screening test 01/30/2016  . Advanced maternal age in multigravida, second trimester     PLAN: Stable. No bleeding. S/P BMZ. Continue with 7 days of observation. Continue routine antenatal care.   Jaynie CollinsUgonna Cayenne Breault,  MD 05/25/2016,10:51 AM

## 2016-05-26 NOTE — Progress Notes (Signed)
Patient ID: [name] Hull, female   DOB: 02/26/1980, 36 y.o.   MRN: [id number] ACULTY PRACTICE ANTEPARTUM COMPREHENSIVE PROGRESS NOTE  [name] Hull is a 36 y.o. G3P2002 at 2232w5d  who is admitted for third trimester vaginal bleeding.  Fetal presentation is cephalic. Length of Stay:  4  Days  Subjective: No complaints this morning.  Patient is Spanish-speaking, Spanish interpreter present for this encounter.  Patient reports good fetal movement.  She reports no uterine contractions, no bleeding and no loss of fluid per vagina.  Vitals:  Blood pressure (!) 117/55, pulse 89, temperature 98.4 F (36.9 C), temperature source Oral, resp. rate 18, height 5\' 4"  (1.626 m), weight 199 lb (90.3 kg), last menstrual period 09/26/2015, SpO2 98 %.  Physical Examination: Lungs Clear   Heart RRR Abd soft + BS gravid non tender Ext non tender Membranes: intact  Fetal Monitoring:  Reactive NST x 2 Baseline Rate (A) 150 bpm filed at 05/25/2016 1636  Variability 6-25 BPM filed at 05/25/2016 1636  Accelerations 15 x 15 filed at 05/25/2016 1636  Decelerations None filed at 05/25/2016 1636   Labs:  No results found for this or any previous visit (from the past 24 hour(s)).  Imaging Studies:    none   Medications:  Scheduled . docusate sodium  100 mg Oral Daily  . prenatal multivitamin  1 tablet Oral Q1200  . sodium chloride flush  3 mL Intravenous Q12H   I have reviewed the patient's current medications.  ASSESSMENT: Patient Active Problem List   Diagnosis Date Noted  . Vaginal bleeding in pregnancy, third trimester 05/22/2016  . LGA (large for gestational age) fetus affecting management of mother 05/14/2016  . 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 Language barrier 04/30/2016  . History of eclampsia 02/17/2016  . Encounter for supervision of high risk multigravida of advanced maternal age, antepartum 02/17/2016  . Abnormal maternal serum screening test 01/30/2016  . Advanced  maternal age in multigravida, second trimester     PLAN: Stable. No bleeding. S/P BMZ. Continue with 7 days of observation after bleeding, last episode was on 05/22/16.  Continue routine antenatal care.   Jaynie CollinsUgonna Anyanwu, MD 05/26/2016,7:03 AM

## 2016-05-27 LAB — TYPE AND SCREEN
ABO/RH(D): O POS
ANTIBODY SCREEN: NEGATIVE

## 2016-05-27 NOTE — Progress Notes (Signed)
Patient ID: [name] Hull, female   DOB: 04/21/1980, 36 y.o.   MRN: [id number] FACULTY PRACTICE ANTEPARTUM COMPREHENSIVE PROGRESS NOTE  [name] Hull is a 36 y.o. G3P2002 at 3761w6d   who is admitted for third trimester vaginal bleeding.  Fetal presentation is cephalic. Length of Stay:  5  Days  Subjective: No complaints this morning. Patient reports good fetal movement.  She reports no uterine contractions, no bleeding and no loss of fluid per vagina.  Vitals:  Blood pressure 117/62, pulse 94, temperature 98.7 F (37.1 C), temperature source Oral, resp. rate 16, height 5\' 4"  (1.626 m), weight 199 lb (90.3 kg), last menstrual period 09/26/2015, SpO2 97 %.  Physical Examination: Lungs Clear   Heart RRR Abd soft + BS gravid non tender Ext non tender Membranes: intact  Fetal Monitoring: 150/mod/+accels, no decels-  Reactive NST   Labs:  Results for orders placed or performed during the hospital encounter of 05/22/16 (from the past 24 hour(s))  [facility]   Collection Time: 05/27/16  6:00 AM  Result Value Ref Range   ABO/RH(D) O POS    Antibody Screen NEG    Sample Expiration 05/30/2016     Imaging Studies:    none   Medications:  Scheduled . docusate sodium  100 mg Oral Daily  . prenatal multivitamin  1 tablet Oral Q1200  . sodium chloride flush  3 mL Intravenous Q12H   I have reviewed the patient's current medications.  ASSESSMENT: Patient Active Problem List   Diagnosis Date Noted  . Vaginal bleeding in pregnancy, third trimester 05/22/2016  . LGA (large for gestational age) fetus affecting management of mother 05/14/2016  . 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 Language barrier 04/30/2016  . History of eclampsia 02/17/2016  . Encounter for supervision of high risk multigravida of advanced maternal age, antepartum 02/17/2016  . Abnormal maternal serum screening test 01/30/2016  . Advanced maternal age in multigravida,  second trimester     PLAN: Stable. No bleeding. S/P BMZ. Continue with 7 days of observation after bleeding, last episode was on 05/22/16.  Continue routine antenatal care.   Federico FlakeKimberly Niles Jaliza Seifried, MD 05/27/2016,4:52 PM

## 2016-05-27 NOTE — Progress Notes (Signed)
Erroneous note. Patient not seen by me.

## 2016-05-28 ENCOUNTER — Ambulatory Visit (HOSPITAL_COMMUNITY): Payer: Self-pay

## 2016-05-28 MED ORDER — DOCUSATE SODIUM 100 MG PO CAPS: 100.0000 mg | ORAL_CAPSULE | Freq: Every day | ORAL | 0 refills | Status: DC

## 2016-05-28 NOTE — Discharge Instructions (Signed)
Reposo plvico (Pelvic Rest) CUNDO SE RECOMIENDA EL REPOSO PLVICO? El reposo plvico puede recomendarse en los siguientes casos:  La placenta cubre de forma parcial o total la abertura del cuello del tero (placenta previa).  Hay sangrado entre la pared del tero y el saco amnitico en el primer trimestre de Psychiatristembarazo (hemorragia subcorinica).  El Gliddentrabajo de parto comienza muy pronto (trabajo de parto prematuro). Segn la salud general de la madre y el feto, el mdico decidir si el reposo plvico es [place]cuado. CMO HAGO REPOSO PLVICO? Durante el tiempo que le indique el mdico:  No tenga relaciones sexuales, estimulacin sexual ni orgasmos.  No use tampones. No se haga duchas vaginales. No se introduzca nada en la vagina.  No levante ningn objeto que pese ms de 10libras (4,5kg).  Evite las actividades que demanden mucho esfuerzo (extenuantes).  Evite las actividades que requieran esfuerzos de los msculos de la pelvis. CUNDO DEBO BUSCAR ATENCIN MDICA? Solicite atencin mdica de inmediato si:  Tiene clicos en la zona inferior del abdomen.  Tiene secrecin de flujo vaginal.  Tiene un dolor sordo en la parte baja de la espalda.  Tiene contracciones regulares.  Tienen tensin uterina. CUNDO DEBO BUSCAR ASISTENCIA MDICA INMEDIATA? Solicite atencin mdica de inmediato si:  Tiene sangrado vaginal y est embarazada. Esta informacin no tiene Theme park managercomo fin reemplazar el consejo del mdico. Asegrese de hacerle al mdico cualquier pregunta que tenga. Document Released: 09/16/2011 Document Revised: 04/15/2015 Document Reviewed: 06/25/2014 Elsevier Interactive Patient Education  2017 Elsevier Inc.   Hemorragia vaginal durante Firefighterel embarazo (tercer trimestre) (Vaginal Bleeding During Pregnancy, Third Trimester) Durante el embarazo, es comn tener una pequea hemorragia vaginal (manchas). A veces, la hemorragia es normal y no representa un problema, pero en algunas  ocasiones es un sntoma de algo grave. Asegrese de decirle a su mdico de inmediato si tiene algn tipo de hemorragia vaginal. CUIDADOS EN EL HOGAR  Controle su afeccin para ver si hay cambios.  Siga las indicaciones de su mdico con respecto al [place]grado de actividad que [place] Chassepuede tener.  Si debe hacer reposo en cama:  Es posible que deba quedarse en cama y levantarse nicamente para ir al bao.  Quizs le permitan hacer The PNC Financialalgunas actividades.  Si es necesario, planifique que alguien la ayude.  Marcelino FreestoneEscriba:  La cantidad de toallas higinicas que [place]ausa cada da.  La frecuencia con la que se cambia las toallas higinicas.  Indique que tan empapados (saturados) estn.  No use tampones.  No se haga duchas vaginales.  No tenga relaciones sexuales ni orgasmos hasta que el mdico la autorice.  Siga los consejos de su mdico acerca de levantar objetos pesados, conducir automviles y Education officer, environmentalrealizar actividad fsica.  Si elimina tejido por la vagina, gurdelo para mostrrselo al American Expressmdico.  Tome los medicamentos solamente como se lo haya indicado el mdico.  No tome aspirina, ya que puede causar hemorragias.  Concurra a todas las visitas de control como se lo haya indicado el mdico. SOLICITE AYUDA SI:  Tiene una hemorragia vaginal.  Tiene clicos.  Tiene dolores de [place] Cityparto.  Tiene fiebre que no desaparece despus de Teacher, adult educationtomar medicamentos. SOLICITE AYUDA DE INMEDIATO SI:  Siente clicos muy intensos en la espalda o en el vientre (abdomen).  Tiene escalofros.  Tiene una prdida de lquido por la vagina.  Elimina cogulos grandes o tejido por la vagina.  Tiene ms hemorragia.  Se siente dbil o que va a desvanecerse.  Pierde el conocimiento (se desmaya).  No siente que el beb se mueva  tanto como antes. ASEGRESE DE QUE:  Comprende estas instrucciones.  Controlar su afeccin.  Recibir ayuda de inmediato si no mejora o si empeora. Esta informacin no tiene Theme park manager el  consejo del mdico. Asegrese de hacerle al mdico cualquier pregunta que tenga. Document Released: 05/08/2013 Document Revised: 05/08/2013 Document Reviewed: 08/29/2012 Elsevier Interactive Patient Education  2017 [employer].

## 2016-05-28 NOTE — Progress Notes (Signed)
Pt discharged with printed instructions displayed in spanish as well as interpreter Etta at bedside. Pt verbalized an understanding. No concerns noted. Carmelina DaneERRI L Devone Tousley, RN

## 2016-05-28 NOTE — Discharge Summary (Signed)
Antenatal Physician Discharge Summary  Patient ID: [name] Hull MRN: [id number] DOB/AGE: Mar 19, 1980 36 y.o.  Admit date: 05/22/2016 Discharge date: 05/28/2016  Admission Diagnoses:[name] Hull is a 36 y.o. [id number] with Estimated Date of Delivery: 07/02/16   By  early ultrasound [redacted]w[redacted]d  who is admitted for 3rd trimester bleeding.    Fetal presentation is cephalic. Patient Active Problem List   Diagnosis Date Noted  . Vaginal bleeding in pregnancy, third trimester 05/22/2016  . LGA (large for gestational age) fetus affecting management of mother 05/14/2016  . 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 Language barrier 04/30/2016  . History of eclampsia 02/17/2016  . Encounter for supervision of high risk multigravida of advanced maternal age, antepartum 02/17/2016  . Abnormal maternal serum screening test 01/30/2016  . Advanced maternal age in multigravida, second trimester      Length of Stay:  6 Days  Date of admission:05/22/2016  Discharge Diagnoses: Vaginal bleeding in pregnancy third trimester resolved                                         LGA infant stable                                         Umbilical vein varix stable, for delivery at 37 weeks, followed by MFM                                         Language barrier                                         History of eclampsia, on baby aspirin daily 81 mg Prenatal Procedures: none  Consults: Neonatology, Maternal Fetal Medicine  Hospital Course:  This is a 36 y.o. [id number] with IUP at [redacted]w[redacted]d admitted for an episode of vaginal bleeding. She was admitted with no contractions, noted to .  No leaking of fluid and she did not bleed after admission. Marland Kitchen  She was observed, fetal heart rate monitoring remained reassuring, and she had no signs/symptoms of progressing preterm labor or other maternal-fetal concerns  She was deemed stable for discharge to home with outpatient follow up after 6 days with no bleeding at  all  Discharge Exam: Temp:  [98.4 F (36.9 C)-98.9 F (37.2 C)] 98.6 F (37 C) (05/23 2345) Pulse Rate:  [85-94] 85 (05/23 2345) Resp:  [16] 16 (05/23 2345) BP: (108-120)/(46-62) 108/46 (05/23 2345) SpO2:  [97 %-100 %] 99 % (05/23 2345) Physical Examination: CONSTITUTIONAL: Well-developed, well-nourished female in no acute distress.  HENT:  Normocephalic, atraumatic, External right and left ear normal. Oropharynx is clear and moist EYES: Conjunctivae and EOM are normal. Pupils are equal, round, and reactive to light. No scleral icterus.  NECK: Normal range of motion, supple, no masses SKIN: Skin is warm and dry. No rash noted. Not diaphoretic. No erythema. No pallor. NEUROLGIC: Alert and oriented to person, place, and time. Normal reflexes, muscle tone coordination. No cranial nerve deficit noted. PSYCHIATRIC: Normal mood and affect. Normal behavior. Normal judgment and thought content. CARDIOVASCULAR: Normal heart rate noted, regular rhythm RESPIRATORY:  Effort and breath sounds normal, no problems with respiration noted MUSCULOSKELETAL: Normal range of motion. No edema and no tenderness. 2+ distal pulses. ABDOMEN: Soft, nontender, nondistended, gravid. CERVIX:  not checked  Fetal monitoring: FHR: 145 bpm, Variability: moderate, Accelerations: Present, Decelerations: Absent  Uterine activity: None contractions per hour  Significant Diagnostic Studies:  Results for orders placed or performed during the hospital encounter of 05/22/16 (from the past 168 hour(s))  Culture, beta strep (group b only)   Collection Time: 05/22/16  8:18 PM  Result Value Ref Range   Specimen Description VAGINAL/RECTAL    Special Requests NONE    Culture      NO GROUP B STREP (S.AGALACTIAE) ISOLATED Performed at [facility], [address], [place], [place] [address]    Report Status 05/24/2016 FINAL   RPR   Collection Time: 05/22/16  8:30 PM  Result Value Ref Range   RPR Ser Ql Non Reactive  Non Reactive  CBC on admission   Collection Time: 05/22/16  8:30 PM  Result Value Ref Range   WBC 12.0 (H) 4.0 - 10.5 K/uL   RBC 3.98 3.87 - 5.11 MIL/uL   Hemoglobin 13.0 12.0 - 15.0 g/dL   HCT 60.4 54.0 - 98.1 %   MCV 94.5 78.0 - 100.0 fL   MCH 32.7 26.0 - 34.0 pg   MCHC 34.6 30.0 - 36.0 g/dL   RDW 19.1 47.8 - 29.5 %   Platelets 322 150 - 400 K/uL  Type and screen [facility]   Collection Time: 05/22/16  8:30 PM  Result Value Ref Range   ABO/RH(D) O POS    Antibody Screen NEG    Sample Expiration 05/25/2016   ABO/Rh   Collection Time: 05/22/16  8:30 PM  Result Value Ref Range   ABO/RH(D) O POS   Type and screen [facility] [place]   Collection Time: 05/27/16  6:00 AM  Result Value Ref Range   ABO/RH(D) O POS    Antibody Screen NEG    Sample Expiration 05/30/2016     Discharge Condition: Stable  Disposition: 01-Home or Self Care   Discharge Instructions    Call MD for:    Complete by:  As directed    [facility] for bleeding or contractions.   Call MD for:  severe uncontrolled pain    Complete by:  As directed    Diet - low sodium heart healthy    Complete by:  As directed    Increase activity slowly    Complete by:  As directed      Allergies as of 05/28/2016   No Known Allergies     Medication List    TAKE these medications   aspirin 81 MG tablet Take 1 tablet (81 mg total) by mouth daily.   docusate sodium 100 MG capsule Commonly known as:  COLACE Take 1 capsule (100 mg total) by mouth daily.   loratadine 10 MG tablet Commonly known as:  CLARITIN Take 10 mg by mouth daily as needed.   PRENATAL VITAMIN PO Take 1 tablet by mouth daily.   TYLENOL 325 MG tablet Generic drug:  acetaminophen Take 650 mg by mouth every 6 (six) hours as needed for moderate pain.      Follow-up Information    [facility] Follow up in 1 week(s).   Specialty:  Obstetrics and Gynecology Contact  information: [address] [place] [address] [phone]  SignedTilda Burrow: Danecia Underdown V M.D. 05/28/2016, 7:53 AM

## 2016-06-02 ENCOUNTER — Ambulatory Visit (HOSPITAL_COMMUNITY)
Admission: RE | Admit: 2016-06-02 | Discharge: 2016-06-02 | Disposition: A | Discharge status: Home / Self Care | Payer: Self-pay | Source: Ambulatory Visit | Attending: Obstetrics and Gynecology | Admitting: Obstetrics and Gynecology

## 2016-06-02 ENCOUNTER — Encounter (HOSPITAL_COMMUNITY): Payer: Self-pay

## 2016-06-02 DIAGNOSIS — O283 Abnormal ultrasonic finding on antenatal screening of mother: Secondary | ICD-10-CM | POA: Insufficient documentation

## 2016-06-02 DIAGNOSIS — O280 Abnormal hematological finding on antenatal screening of mother: Secondary | ICD-10-CM

## 2016-06-02 DIAGNOSIS — O09523 Supervision of elderly multigravida, third trimester: Secondary | ICD-10-CM | POA: Insufficient documentation

## 2016-06-02 DIAGNOSIS — Z3A35 35 weeks gestation of pregnancy: Secondary | ICD-10-CM | POA: Insufficient documentation

## 2016-06-02 DIAGNOSIS — O358XX0 Maternal care for other (suspected) fetal abnormality and damage, not applicable or unspecified: Secondary | ICD-10-CM | POA: Insufficient documentation

## 2016-06-02 DIAGNOSIS — O1503 Eclampsia in pregnancy, third trimester: Secondary | ICD-10-CM | POA: Insufficient documentation

## 2016-06-02 LAB — GC/CHLAMYDIA PROBE AMP (~~LOC~~) NOT AT ARMC
CHLAMYDIA, DNA PROBE: NEGATIVE
Neisseria Gonorrhea: NEGATIVE

## 2016-06-05 ENCOUNTER — Encounter (HOSPITAL_COMMUNITY): Payer: Self-pay

## 2016-06-05 ENCOUNTER — Ambulatory Visit (HOSPITAL_COMMUNITY)
Admission: RE | Admit: 2016-06-05 | Discharge: 2016-06-05 | Disposition: A | Discharge status: Home / Self Care | Payer: Self-pay | Source: Ambulatory Visit | Attending: Obstetrics and Gynecology | Admitting: Obstetrics and Gynecology

## 2016-06-05 ENCOUNTER — Other Ambulatory Visit (HOSPITAL_COMMUNITY): Payer: Self-pay | Admitting: Maternal & Fetal Medicine

## 2016-06-05 DIAGNOSIS — O09299 Supervision of pregnancy with other poor reproductive or obstetric history, unspecified trimester: Secondary | ICD-10-CM | POA: Insufficient documentation

## 2016-06-05 DIAGNOSIS — O358XX0 Maternal care for other (suspected) fetal abnormality and damage, not applicable or unspecified: Secondary | ICD-10-CM | POA: Insufficient documentation

## 2016-06-05 DIAGNOSIS — Z3A36 36 weeks gestation of pregnancy: Secondary | ICD-10-CM | POA: Insufficient documentation

## 2016-06-05 DIAGNOSIS — O289 Unspecified abnormal findings on antenatal screening of mother: Secondary | ICD-10-CM | POA: Insufficient documentation

## 2016-06-05 DIAGNOSIS — O09521 Supervision of elderly multigravida, first trimester: Secondary | ICD-10-CM | POA: Insufficient documentation

## 2016-06-05 NOTE — Addendum Note (Signed)
Encounter addended by: Joycelyn RuaMurrow, Shirrell Solinger Mae on: 06/05/2016 11:29 AM<BR>    Actions taken: Imaging Exam ended

## 2016-06-05 NOTE — Addendum Note (Signed)
Encounter addended by: Joycelyn RuaMurrow, Aydrian Halpin Mae on: 06/05/2016 11:33 AM<BR>    Actions taken: Imaging Exam ended

## 2016-06-05 NOTE — Progress Notes (Signed)
Pt reports dizziness, mild headache and SOB that started this week.  Pulse ox 98%.  Pt also feeling anxious.

## 2016-06-08 ENCOUNTER — Ambulatory Visit (INDEPENDENT_AMBULATORY_CARE_PROVIDER_SITE_OTHER): Payer: Self-pay | Admitting: Obstetrics and Gynecology

## 2016-06-08 VITALS — BP 124/56 | HR 97 | Wt 200.6 lb

## 2016-06-08 DIAGNOSIS — O4693 Antepartum hemorrhage, unspecified, third trimester: Secondary | ICD-10-CM

## 2016-06-08 DIAGNOSIS — Z8759 Personal history of other complications of pregnancy, childbirth and the puerperium: Secondary | ICD-10-CM

## 2016-06-08 DIAGNOSIS — O09529 Supervision of elderly multigravida, unspecified trimester: Secondary | ICD-10-CM

## 2016-06-08 DIAGNOSIS — O09522 Supervision of elderly multigravida, second trimester: Secondary | ICD-10-CM

## 2016-06-08 DIAGNOSIS — O280 Abnormal hematological finding on antenatal screening of mother: Secondary | ICD-10-CM

## 2016-06-08 DIAGNOSIS — O09523 Supervision of elderly multigravida, third trimester: Secondary | ICD-10-CM

## 2016-06-08 NOTE — Patient Instructions (Signed)
Parto vaginal (Vaginal Delivery) Durante el parto, el mdico la ayudar a dar a luz a su beb. En elparto vaginal, deber pujar para que el beb salga por la vagina. Sin embargo, antes de que pueda sacar al beb, es necesario que ocurran ciertas cosas. La abertura del tero (cuello del tero) tiene que ablandarse, hacerse ms delgado y abrirse (dilatar) hasta que llegue a 10 cm. Adems, el beb tiene que bajar desde el tero a la vagina. SIGNOS DE TRABAJO DE PARTO  El mdico tendr primero que asegurarse de que usted est en trabajo de parto. Algunos signos son:   Eliminar lo que se llama tapn mucoso antes del inicio del trabajo de parto. Este es una pequea cantidad de mucosidad teida con sangre.  Tener contracciones uterinas regulares y dolorosas.   El tiempo entre las contracciones debe acortarse  Las molestias y el dolor se harn ms intensos gradualmente.  El dolor de las contracciones empeora al caminar y no se alivia con el reposo.   El cuello del tero se hace mas delgado (se borra) y se dilata. ANTES DEL PARTO Una vez que se inicie el trabajo de parto y sea admitida en el hospital o sanatorio, el mdico podr hacer lo siguiente:   Realizar un examen fsico.  Controlar si hay complicaciones relacionadas con el trabajo de parto.  Verificar su presin arterial, temperatura y pulso y la frecuencia cardaca (signos vitales).   Determinar si se ha roto el saco amnitico y cundo ha ocurrido.  Realizar un examen vaginal (utilizando un guante estril y un lubricante) para determinar: ? La posicin (presentacin) del beb. El beb se presenta con la cabeza primero (vertex) en el canal de parto (vagina), o estn los pies o las nalgas primero (de nalgas)? ? El nivel (estacin) de la cabeza del beb dentro del canal de parto. ? El borramiento y la dilatacin del cuello uterino  El monitor fetal electrnico generalmente se coloca sobre el abdomen al llegar. Se utiliza para  controlar las contracciones y la frecuencia cardaca del beb. ? Cuando el monitor est en el abdomen (monitor fetal externo), slo toma la frecuencia y la duracin de las contracciones. No informa acerca de la intensidad de las contracciones. ? Si el mdico necesita saber exactamente la intensidad de las contracciones o cul es la frecuencia cardaca del beb, colocar un monitor interno en la vagina y el tero. El mdico comentar los riesgos y los beneficios de usar un monitor interno y le pedir autorizacin antes de colocar el dispositivo. ? El monitoreo fetal continuo ser necesario si le han aplicado una epidural, si le administran ciertos medicamentos (como oxitocina) y si tiene complicaciones del embarazo o del trabajo de parto.  Podrn colocarle una va intravenosa en una vena del brazo para suministrarle lquidos y medicamentos, si es necesario. TRES ETAPAS DEL TRABAJO DE PARTO Y EL PARTO El trabajo de parto y el parto normales se dividen en tres etapas. Primera etapa Esta etapa comienza cuando comienzan las contracciones regulares y el cuello comienza a borrarse y dilatarse. Finaliza cuando el cuello est completamente abierto (completamente dilatado). La primera etapa es la etapa ms larga del trabajo de parto y puede durar desde 3 horas a 15 horas.  Algunos mtodos estn disponibles para ayudar con el dolor del parto. Usted y su mdico decidirn qu opcin es la mejor para usted. Las opciones incluyen:   Medicamentos narcticos. Estos son medicamentos fuertes que usted puede recibir a travs de una va intravenosa o   como inyeccin en el msculo. Estos medicamentos alivian el dolor pero no hacen que desaparezca completamente.  Epidural. Se administra un medicamento a travs de un tubo delgado que se inserta en la espalda. El medicamento adormece la parte inferior del cuerpo y evita el dolor en esa zona.  Bloqueo paracervical Es una inyeccin de un anestsico en cada lado del cuello  uterino.  Usted podr pedir un parto natural, que implica que no se usen analgsicos ni epidural durante el parto y el trabajo de parto. En cambio, podr tener otro tipo de ayuda como ejercicios respiratorios para hacer frente al dolor. Segunda etapa La segunda etapa del trabajo de parto comienza cuando el cuello se ha dilatado completamente a 10 cm. Contina hasta que usted puja al beb hacia abajo, por el canal de parto, y el beb nace. Esta etapa puede durar slo algunos minutos o algunas horas.  La posicin del la cabeza del beb a medida que pasa por el canal de parto, es informada como un nmero, llamado estacin. Si la cabeza del beb no ha iniciado su descenso, la estacin se describe como que est en menos 3 (-3). Cuando la cabeza del beb est en la estacin cero, est en el medio del canal de parto y se encaja en la pelvis. La estacin en la que se encuentra el beb indica el progreso de la segunda etapa del trabajo de parto.  Cuando el beb nace, el mdico lo sostendr con la cabeza hacia abajo para evitar que el lquido amnitico, el moco y la sangre entren en los pulmones del beb. La boca y la nariz del beb podrn ser succionadas con un pequeo bulbo para retirar todo lquido adicional.  El mdico podr colocar al beb sobre su estmago. Es importante evitar que el beb tome fro. Para hacerlo, el mdico secar al beb, lo colocar directamente sobre su piel, (sin mantas entre usted y el beb) y lo cubrir con mantas secas y tibias.  Se corta el cordn umbilical. Tercera etapa Durante la tercera etapa del trabajo de parto, el mdico sacar la placenta (alumbramiento) y se asegurar de que el sangrado est controlado. La salida de la placenta generalmente demora 5 minutos pero puede tardar hasta 30 minutos. Luego de la salida de la placenta, le darn un medicamento por va intravenosa o inyectable para ayudar a contraer el tero y controlar el sangrado. Si planea amamantar al beb,  puede intentar en este momento Luego de la salida de la placenta, el tero debe contraerse y quedar muy firme. Si el tero no queda firme, el mdico lo masajear. Esto es importante debido a que la contraccin del tero ayuda a cortar el sangrado en el sitio en que la placenta estaba unida al tero. Si el tero no se contrae adecuadamente ni permanece firme, podr causar un sangrado abundante. Si hay mucho sangrado, podrn darle medicamentos para contraer el tero y detener el sangrado.  Esta informacin no tiene como fin reemplazar el consejo del mdico. Asegrese de hacerle al mdico cualquier pregunta que tenga. Document Released: 12/05/2007 Document Revised: 01/12/2014 Elsevier Interactive Patient Education  2017 Elsevier Inc.  

## 2016-06-08 NOTE — Progress Notes (Signed)
Subjective:  [name] Zavala HernandezLaurelyn Sickle is a 36 y.o. G3P2002 at 2158w4d being seen today for ongoing prenatal care.  She is currently monitored for the following issues for this high-risk pregnancy and has Advanced maternal age in multigravida, second trimester; Abnormal maternal serum screening test; History of eclampsia; Encounter for supervision of high risk multigravida of advanced maternal age, antepartum; Language barrier; LGA (large for gestational age) fetus affecting management of mother; Umbilical vein varix; and Vaginal bleeding in pregnancy, third trimester on her problem list.  Patient reports occ BH and general discomforts of pregnancy.  Contractions: Irritability. Vag. Bleeding: None.  Movement: Present. Denies leaking of fluid.   The following portions of the patient's history were reviewed and updated as appropriate: allergies, current medications, past family history, past medical history, past social history, past surgical history and problem list. Problem list updated.  Objective:   Vitals:   06/08/16 1516  BP: (!) 124/56  Pulse: 97  Weight: 91 kg (200 lb 9.6 oz)    Fetal Status: Fetal Heart Rate (bpm): 170   Movement: Present     General:  Alert, oriented and cooperative. Patient is in no acute distress.  Skin: Skin is warm and dry. No rash noted.   Cardiovascular: Normal heart rate noted  Respiratory: Normal respiratory effort, no problems with respiration noted  Abdomen: Soft, gravid, appropriate for gestational age. Pain/Pressure: Present     Pelvic:  Cervical exam deferred        Extremities: Normal range of motion.  Edema: Trace  Mental Status: Normal mood and affect. Normal behavior. Normal judgment and thought content.   Urinalysis:      Assessment and Plan:  Pregnancy: G3P2002 at 3858w4d  1. Abnormal maternal serum screening test Nl Panorama  2. Advanced maternal age in multigravida, second trimester As above  3. History of eclampsia Stable  4. Vaginal  bleeding in pregnancy, third trimester Was hospitalized from 5/18 to 5/24. No bleeding since discharge S/P BMZ on 5/189 and 5/19 GBS and GC/C obtained then as well, all negative  5. Encounter for supervision of high risk multigravida of advanced maternal age, antepartum Stable Labor precautions  Preterm labor symptoms and general obstetric precautions including but not limited to vaginal bleeding, contractions, leaking of fluid and fetal movement were reviewed in detail with the patient. Please refer to After Visit Summary for other counseling recommendations.  Return in about 1 week (around 06/15/2016) for OB visit.   Hermina StaggersErvin, Madelyne Millikan L, MD

## 2016-06-08 NOTE — Progress Notes (Signed)
Video Interpreter # [id number]

## 2016-06-09 ENCOUNTER — Ambulatory Visit (HOSPITAL_COMMUNITY)
Admission: RE | Admit: 2016-06-09 | Discharge: 2016-06-09 | Disposition: A | Discharge status: Home / Self Care | Payer: Self-pay | Source: Ambulatory Visit | Attending: Obstetrics and Gynecology | Admitting: Obstetrics and Gynecology

## 2016-06-09 ENCOUNTER — Encounter (HOSPITAL_COMMUNITY): Payer: Self-pay

## 2016-06-09 DIAGNOSIS — Z3A36 36 weeks gestation of pregnancy: Secondary | ICD-10-CM | POA: Insufficient documentation

## 2016-06-09 DIAGNOSIS — O358XX0 Maternal care for other (suspected) fetal abnormality and damage, not applicable or unspecified: Secondary | ICD-10-CM

## 2016-06-09 DIAGNOSIS — O0990 Supervision of high risk pregnancy, unspecified, unspecified trimester: Secondary | ICD-10-CM | POA: Insufficient documentation

## 2016-06-12 ENCOUNTER — Encounter (HOSPITAL_COMMUNITY): Payer: Self-pay

## 2016-06-12 ENCOUNTER — Inpatient Hospital Stay (HOSPITAL_COMMUNITY)
Admission: AD | Admit: 2016-06-12 | Discharge: 2016-06-17 | DRG: 766 | Disposition: A | Discharge status: Home / Self Care | Payer: Medicaid Other | Source: Ambulatory Visit | Attending: Obstetrics & Gynecology | Admitting: Obstetrics & Gynecology

## 2016-06-12 ENCOUNTER — Ambulatory Visit (HOSPITAL_COMMUNITY)
Admission: RE | Admit: 2016-06-12 | Discharge: 2016-06-12 | Disposition: A | Discharge status: Home / Self Care | Payer: Self-pay | Source: Ambulatory Visit | Attending: Obstetrics and Gynecology | Admitting: Obstetrics and Gynecology

## 2016-06-12 ENCOUNTER — Encounter (HOSPITAL_COMMUNITY): Payer: Self-pay | Admitting: *Deleted

## 2016-06-12 DIAGNOSIS — Z3A37 37 weeks gestation of pregnancy: Secondary | ICD-10-CM | POA: Insufficient documentation

## 2016-06-12 DIAGNOSIS — O322XX0 Maternal care for transverse and oblique lie, not applicable or unspecified: Secondary | ICD-10-CM | POA: Diagnosis present

## 2016-06-12 DIAGNOSIS — F329 Major depressive disorder, single episode, unspecified: Secondary | ICD-10-CM | POA: Diagnosis present

## 2016-06-12 DIAGNOSIS — O99344 Other mental disorders complicating childbirth: Secondary | ICD-10-CM | POA: Diagnosis present

## 2016-06-12 DIAGNOSIS — O280 Abnormal hematological finding on antenatal screening of mother: Secondary | ICD-10-CM

## 2016-06-12 DIAGNOSIS — Z6834 Body mass index (BMI) 34.0-34.9, adult: Secondary | ICD-10-CM | POA: Insufficient documentation

## 2016-06-12 DIAGNOSIS — Z302 Encounter for sterilization: Secondary | ICD-10-CM | POA: Diagnosis not present

## 2016-06-12 DIAGNOSIS — O99213 Obesity complicating pregnancy, third trimester: Secondary | ICD-10-CM | POA: Insufficient documentation

## 2016-06-12 DIAGNOSIS — F419 Anxiety disorder, unspecified: Secondary | ICD-10-CM | POA: Diagnosis present

## 2016-06-12 DIAGNOSIS — O3663X0 Maternal care for excessive fetal growth, third trimester, not applicable or unspecified: Secondary | ICD-10-CM | POA: Diagnosis present

## 2016-06-12 DIAGNOSIS — O34211 Maternal care for low transverse scar from previous cesarean delivery: Secondary | ICD-10-CM | POA: Diagnosis present

## 2016-06-12 DIAGNOSIS — O09523 Supervision of elderly multigravida, third trimester: Secondary | ICD-10-CM | POA: Insufficient documentation

## 2016-06-12 DIAGNOSIS — E669 Obesity, unspecified: Secondary | ICD-10-CM | POA: Insufficient documentation

## 2016-06-12 DIAGNOSIS — O324XX0 Maternal care for high head at term, not applicable or unspecified: Secondary | ICD-10-CM | POA: Diagnosis present

## 2016-06-12 DIAGNOSIS — O09293 Supervision of pregnancy with other poor reproductive or obstetric history, third trimester: Secondary | ICD-10-CM | POA: Insufficient documentation

## 2016-06-12 DIAGNOSIS — Z98891 History of uterine scar from previous surgery: Secondary | ICD-10-CM

## 2016-06-12 DIAGNOSIS — O283 Abnormal ultrasonic finding on antenatal screening of mother: Secondary | ICD-10-CM

## 2016-06-12 DIAGNOSIS — O358XX0 Maternal care for other (suspected) fetal abnormality and damage, not applicable or unspecified: Secondary | ICD-10-CM | POA: Insufficient documentation

## 2016-06-12 LAB — CBC
HEMATOCRIT: 39 % (ref 36.0–46.0)
Hemoglobin: 13.4 g/dL (ref 12.0–15.0)
MCH: 32.8 pg (ref 26.0–34.0)
MCHC: 34.4 g/dL (ref 30.0–36.0)
MCV: 95.6 fL (ref 78.0–100.0)
Platelets: 272 10*3/uL (ref 150–400)
RBC: 4.08 MIL/uL (ref 3.87–5.11)
RDW: 14.4 % (ref 11.5–15.5)
WBC: 10.3 10*3/uL (ref 4.0–10.5)

## 2016-06-12 LAB — GLUCOSE, CAPILLARY
Glucose-Capillary: 97 mg/dL (ref 65–99)
Glucose-Capillary: 151 mg/dL — ABNORMAL HIGH (ref 65–99)

## 2016-06-12 MED ORDER — OXYTOCIN BOLUS FROM INFUSION: 500.0000 mL | Freq: Once | INTRAVENOUS | Status: DC

## 2016-06-12 MED ORDER — SOD CITRATE-CITRIC ACID 500-334 MG/5ML PO SOLN
30.0000 mL | ORAL | Status: DC | PRN
  Administered 2016-06-14: 30 mL via ORAL
  Filled 2016-06-12 (×2): qty 15

## 2016-06-12 MED ORDER — ONDANSETRON HCL 4 MG/2ML IJ SOLN: 4.0000 mg | Freq: Four times a day (QID) | INTRAMUSCULAR | Status: DC | PRN

## 2016-06-12 MED ORDER — LIDOCAINE HCL (PF) 1 % IJ SOLN: 30.0000 mL | INTRAMUSCULAR | Status: DC | PRN

## 2016-06-12 MED ORDER — MISOPROSTOL 50MCG HALF TABLET: 50.0000 ug | ORAL_TABLET | ORAL | Status: DC

## 2016-06-12 MED ORDER — OXYCODONE-ACETAMINOPHEN 5-325 MG PO TABS: 1.0000 | ORAL_TABLET | ORAL | Status: DC | PRN

## 2016-06-12 MED ORDER — BUTALBITAL-APAP-CAFFEINE 50-325-40 MG PO TABS
2.0000 | ORAL_TABLET | Freq: Once | ORAL | Status: AC
Start: 2016-06-12 — End: 2016-06-12
  Administered 2016-06-12: 2 via ORAL
  Filled 2016-06-12: qty 2

## 2016-06-12 MED ORDER — OXYCODONE-ACETAMINOPHEN 5-325 MG PO TABS: 2.0000 | ORAL_TABLET | ORAL | Status: DC | PRN

## 2016-06-12 MED ORDER — TERBUTALINE SULFATE 1 MG/ML IJ SOLN: 0.2500 mg | Freq: Once | INTRAMUSCULAR | Status: DC | PRN

## 2016-06-12 MED ORDER — MISOPROSTOL 200 MCG PO TABS
50.0000 ug | ORAL_TABLET | ORAL | Status: DC
  Administered 2016-06-12 – 2016-06-13 (×3): 50 ug via ORAL
  Filled 2016-06-12 (×3): qty 1

## 2016-06-12 MED ORDER — FENTANYL CITRATE (PF) 100 MCG/2ML IJ SOLN
100.0000 ug | INTRAMUSCULAR | Status: DC | PRN
  Administered 2016-06-13 – 2016-06-14 (×5): 100 ug via INTRAVENOUS
  Filled 2016-06-12 (×5): qty 2

## 2016-06-12 MED ORDER — OXYTOCIN 40 UNITS IN LACTATED RINGERS INFUSION - SIMPLE MED
1.0000 m[IU]/min | INTRAVENOUS | Status: DC
  Administered 2016-06-12: 2 m[IU]/min via INTRAVENOUS
  Filled 2016-06-12: qty 1000

## 2016-06-12 MED ORDER — LACTATED RINGERS IV BOLUS (SEPSIS)
1000.0000 mL | Freq: Once | INTRAVENOUS | Status: AC
  Administered 2016-06-12: 1000 mL via INTRAVENOUS

## 2016-06-12 MED ORDER — ACETAMINOPHEN 325 MG PO TABS
650.0000 mg | ORAL_TABLET | ORAL | Status: DC | PRN
  Administered 2016-06-13 – 2016-06-14 (×3): 650 mg via ORAL
  Filled 2016-06-12 (×3): qty 2

## 2016-06-12 MED ORDER — LACTATED RINGERS IV SOLN
INTRAVENOUS | Status: DC
  Administered 2016-06-12 – 2016-06-14 (×5): via INTRAVENOUS

## 2016-06-12 MED ORDER — OXYTOCIN 40 UNITS IN LACTATED RINGERS INFUSION - SIMPLE MED: 2.5000 [IU]/h | INTRAVENOUS | Status: DC

## 2016-06-12 MED ORDER — LACTATED RINGERS IV SOLN
500.0000 mL | INTRAVENOUS | Status: DC | PRN
  Administered 2016-06-14: 1000 mL via INTRAVENOUS
  Administered 2016-06-14: 500 mL via INTRAVENOUS

## 2016-06-12 MED ORDER — TERBUTALINE SULFATE 1 MG/ML IJ SOLN
0.2500 mg | Freq: Once | INTRAMUSCULAR | Status: AC
  Administered 2016-06-12: 0.25 mg via SUBCUTANEOUS
  Filled 2016-06-12: qty 1

## 2016-06-12 NOTE — H&P (Signed)
[name] Hull is a 36 y.o. female G3P2002 @[redacted]w[redacted]d  [facility] for external version for transverse fetal lie and IOL at term for umbilical varix if version successful.  This pregnancy has been complicated by umbilical varix, LGA with normal 2 hour GTT, and AMA.  She had eclampsia in previous pregnancy but no HTN this pregnancy.  She did present to MAU with h/a which resolved after Fioricet x 2.  Her h/a is similar to previous migraines per pt.  She reports irregular mild contractions, good fetal movement, denies LOF, vaginal bleeding, vaginal itching/burning, urinary symptoms, dizziness, n/v, or fever/chills.     [facility]  Dating  14 wk US Blood type: O/Positive/-- (12/07 0000)   Genetic Screen  Quad: Abnormal    NIPS: wnl Antibody:Negative (12/07 0000)  Anatomic US  normal Rubella: Immune (12/07 0000)  GTT Early:       87        Third trimester:  RPR: Nonreactive (12/07 0000)   Flu vaccine  12/12/2015 HBsAg: Negative (12/07 0000)   TDaP vaccine  04/17/2016                                           HIV: Non-reactive (12/07 0000)   Baby Food  Breast and formula                                            GBS: (For PCN allergy, check sensitivities)  Contraception IUD Pap: Normal 12/12/2015  Circumcision n/a   Pediatrician [facility]   Support Person   Boyfriend and first child        OB History    Gravida Para Term Preterm AB Living   3 2 2     2    SAB TAB Ectopic Multiple Live Births           2     Past Medical History:  Diagnosis Date  . Anxiety   . Depression   . Foot fracture, left    as child  . Gestational diabetes   . Hypertension   . Pregnancy induced hypertension   . Seizure disorder during pregnancy [facility](HCC) 2012   Past Surgical History:  Procedure Laterality Date  . NO PAST SURGERIES     Family History: family history includes Diabetes in her mother; Hypertension in her mother. Social History:  reports that she has never  smoked. She has never used smokeless tobacco. She reports that she does not drink alcohol or use drugs.     Maternal Diabetes: No Genetic Screening: Abnormal:  Results: Other: Maternal Ultrasounds/Referrals: Normal Fetal Ultrasounds or other Referrals:  None Maternal Substance Abuse:  No Significant Maternal Medications:  None Significant Maternal Lab Results:  Lab values include: Group B Strep negative Other Comments:  Umbilical vein varix  ROS Maternal Medical History:  Reason for admission: External version followed by IOL for umbilical varix  Contractions: Onset was 3-5 hours ago.   Frequency: irregular.   Perceived severity is mild.    Fetal activity: Perceived fetal activity is normal.   Last perceived fetal movement was within the past hour.    Prenatal Complications - Diabetes: none.    Dilation: 1 Effacement (%): 30 Blood pressure 99/77, pulse  85, temperature 98.4 F (36.9 C), temperature source Oral, resp. rate 16, height 5\' 4"  (1.626 m), weight 203 lb (92.1 kg), last menstrual period 09/26/2015, SpO2 99 %. Maternal Exam:  Uterine Assessment: Contraction strength is mild.  Contraction frequency is irregular.   Abdomen: Patient reports no abdominal tenderness. Fetal presentation: shoulder  Pelvis: adequate for delivery.   Cervix: Cervix evaluated by digital exam.     Fetal Exam Fetal Monitor Review: Mode: ultrasound.   Baseline rate: 135.  Variability: moderate (6-25 bpm).   Pattern: accelerations present and no decelerations.    Fetal State Assessment: Category I - tracings are normal.     Physical Exam  Nursing note and vitals reviewed. Constitutional: She is oriented to person, place, and time. She appears well-developed and well-nourished.  Neck: Normal range of motion.  Cardiovascular: Normal rate.   Respiratory: Effort normal.  GI: Soft.  Musculoskeletal: Normal range of motion.  Neurological: She is alert and oriented to person, place, and  time.  Skin: Skin is warm and dry.  Psychiatric: She has a normal mood and affect. Her behavior is normal. Judgment and thought content normal.    Prenatal labs: ABO, Rh: --/--/O POS (05/23 0600) Antibody: NEG (05/23 0600) Rubella: Immune (12/07 0000) RPR: Non Reactive (05/18 2030)  HBsAg: Negative (12/07 0000)  HIV: Non Reactive (04/13 0843)  GBS:   Negative  Assessment/Plan: 1. Umbilical vein varix   2. Transverse lie of fetus, single or unspecified fetus   3. Abnormal maternal serum screening test   4.  Previous C/S x 1  Admit for IOL  VBAC Consent done per pt Mag sulfate 4g bolus/2g per hour     Sharen Counter 06/12/2016, 1:41 PM

## 2016-06-12 NOTE — Progress Notes (Signed)
[name] Hull is a 36 y.o. G3P2002 at 7125w1d  admitted for evaluation for ECV prior to IOL for umbilical varix.  Subjective: Good FM  Objective: BP 119/66   Pulse 86   Temp 98 F (36.7 C) (Oral)   Resp 18   Ht 5\' 4"  (1.626 m)   Wt 203 lb (92.1 kg)   LMP 09/26/2015   SpO2 99%   BMI 34.84 kg/m  No intake/output data recorded. No intake/output data recorded.  FHT:  FHR: 140 bpm, variability: moderate,  accelerations:  Present,  decelerations:  Absent UC:   irregular SVE:   Dilation: 1 Effacement (%): 30  Labs: Lab Results  Component Value Date   WBC 12.0 (H) 05/22/2016   HGB 13.0 05/22/2016   HCT 37.6 05/22/2016   MCV 94.5 05/22/2016   PLT 322 05/22/2016    Assessment / Plan: transverse head right Umbilical varix Patient agrees to ECV, failure, labor, pain, emergency delivery by CS risk discussed with interpreter present and signed consent  [name] Hull 06/12/2016, 4:04 PM

## 2016-06-12 NOTE — Progress Notes (Addendum)
Patient seen and doing well. Feeling some mild contractions.   US preformed and patient is transverse again.  FHTs: 155/mod var/reactive Cervix: c/t/h  A/P will stop pitocin and transition to cytotec at this time. Once cervix ripe will attempt version again.  Ernestina PennaNicholas Adelaida Reindel, MD 06/12/16 9:23 PM

## 2016-06-12 NOTE — Progress Notes (Signed)
[name] Hull is a 36 y.o. G3P2002 at 7145w1d by  admitted for induction of labor due to umbilical vein varix, as directed by MFM. The baby remains unstable, FI 18, and was converted from transverse with head to right, to vertex, and now the baby has turned to transverse, back anterior, with head to the left. Consent again signed, and the baby easily rotated back to vertex, but at a high station, essentially out of the pelvis. We will place an abdominal binder, with towels on each side, to attempt to stabilize fetal axis, and begin pitocin. The baby will be rescanned as needed to confirm that it is indeed vertex as labor progresses..  Subjective: Abdominal laxity.  Objective: BP 130/71   Pulse 94   Temp 98 F (36.7 C) (Oral)   Resp 20   Ht 5\' 4"  (1.626 m)   Wt 92.1 kg (203 lb)   LMP 09/26/2015   SpO2 99%   BMI 34.84 kg/m  No intake/output data recorded. No intake/output data recorded.  FHT:  FHR: 135 bpm, variability: moderate,  accelerations:  Present,  decelerations:  Absent UC:   none SVE:   Dilation: 1 Effacement (%): 30 Station: -3 Exam by:: H Koran RNC  Vertex ballottable out of pelvis by u/s Labs: Lab Results  Component Value Date   WBC 10.3 06/12/2016   HGB 13.4 06/12/2016   HCT 39.0 06/12/2016   MCV 95.6 06/12/2016   PLT 272 06/12/2016    Assessment / Plan: Induction of labor due to 1 cm umbilical vein varix.,  progressing well on pitocin  Labor: will begin pitocin soon Preeclampsia:   Fetal Wellbeing:  Category I Pain Control:   I/D:  n/a Anticipated MOD:  uncertain , depends on presenting part remaining stable.  [name] Hull 06/12/2016, 6:30 PM

## 2016-06-12 NOTE — Anesthesia Pain Management Evaluation Note (Signed)
  CRNA Pain Management Visit Note  Patient: [name] Hull, 36 y.o., female  "Hello I am a [facility]. We have an anesthesia team available at all times to provide care throughout the hospital, including epidural management and anesthesia for C-section. I don't know your plan for the delivery whether it a natural birth, water birth, IV sedation, nitrous supplementation, doula or epidural, but we want to meet your pain goals."   1.Was your pain managed to your expectations on prior hospitalizations?   Yes   2.What is your expectation for pain management during this hospitalization?     Epidural  3.How can we help you reach that goal? unsure  Record the patient's initial score and the patient's pain goal.   Pain: 7  Pain Goal: 10 [facility] wants you to be able to say your pain was always managed very well.  Cephus ShellingBURGER,Itzel Mckibbin 06/12/2016

## 2016-06-12 NOTE — MAU Note (Signed)
+  contractions since yesterday Rating pain 5-6/10  Increased vaginal pressure +dizziness +anxious feeling x3 days +itching, generalized  +headache x1 week Rating pain 5-6/10 Tried tylenol with no relief  Denies vision changes, epigastric pain, increase in swelling  +vaginal bleeding when she went to the bathroom yesterday but none today  Denies LOF.   +FM  RN received report from MFM that patient needed to be delivered today. MFM states they would notify Dr. Debroah LoopArnold. RN informed MFM that NICU needed notification as well.

## 2016-06-12 NOTE — MAU Note (Signed)
Urine in lab 

## 2016-06-13 LAB — RPR: RPR Ser Ql: NONREACTIVE

## 2016-06-13 LAB — GLUCOSE, CAPILLARY
GLUCOSE-CAPILLARY: 128 mg/dL — AB (ref 65–99)
Glucose-Capillary: 99 mg/dL (ref 65–99)
Glucose-Capillary: 76 mg/dL (ref 65–99)
Glucose-Capillary: 73 mg/dL (ref 65–99)
Glucose-Capillary: 175 mg/dL — ABNORMAL HIGH (ref 65–99)

## 2016-06-13 NOTE — Progress Notes (Signed)
Patient seen and doing well. Feeling contractions. Last cytotec placed at 6AM. Cervix 1.5/80/-3. FHTs: 145/mod var.  Bedside US preformed - Vertex, head moves deeper into pelvis with contractions  A/P will plan to transition to pitocin at 10AM.  Ernestina PennaNicholas Corah Willeford, MD 06/13/16 6:43 AM

## 2016-06-14 ENCOUNTER — Inpatient Hospital Stay (HOSPITAL_COMMUNITY): Payer: Medicaid Other | Admitting: Anesthesiology

## 2016-06-14 ENCOUNTER — Encounter (HOSPITAL_COMMUNITY): Payer: Self-pay | Admitting: Anesthesiology

## 2016-06-14 ENCOUNTER — Encounter (HOSPITAL_COMMUNITY)
Admission: AD | Disposition: A | Discharge status: Home / Self Care | Payer: Self-pay | Source: Ambulatory Visit | Attending: Obstetrics & Gynecology

## 2016-06-14 DIAGNOSIS — O99344 Other mental disorders complicating childbirth: Secondary | ICD-10-CM | POA: Diagnosis present

## 2016-06-14 DIAGNOSIS — O324XX0 Maternal care for high head at term, not applicable or unspecified: Secondary | ICD-10-CM | POA: Diagnosis present

## 2016-06-14 DIAGNOSIS — Z3A37 37 weeks gestation of pregnancy: Secondary | ICD-10-CM | POA: Diagnosis not present

## 2016-06-14 DIAGNOSIS — F329 Major depressive disorder, single episode, unspecified: Secondary | ICD-10-CM | POA: Diagnosis present

## 2016-06-14 DIAGNOSIS — F419 Anxiety disorder, unspecified: Secondary | ICD-10-CM | POA: Diagnosis present

## 2016-06-14 DIAGNOSIS — Z302 Encounter for sterilization: Secondary | ICD-10-CM | POA: Diagnosis not present

## 2016-06-14 DIAGNOSIS — O322XX0 Maternal care for transverse and oblique lie, not applicable or unspecified: Secondary | ICD-10-CM | POA: Diagnosis present

## 2016-06-14 DIAGNOSIS — O3663X0 Maternal care for excessive fetal growth, third trimester, not applicable or unspecified: Secondary | ICD-10-CM | POA: Diagnosis present

## 2016-06-14 DIAGNOSIS — O34211 Maternal care for low transverse scar from previous cesarean delivery: Secondary | ICD-10-CM | POA: Diagnosis present

## 2016-06-14 LAB — CBC
HEMATOCRIT: 31.2 % — AB (ref 36.0–46.0)
HEMOGLOBIN: 10.7 g/dL — AB (ref 12.0–15.0)
MCH: 32.7 pg (ref 26.0–34.0)
MCHC: 34.3 g/dL (ref 30.0–36.0)
MCV: 95.4 fL (ref 78.0–100.0)
Platelets: 193 10*3/uL (ref 150–400)
RBC: 3.27 MIL/uL — ABNORMAL LOW (ref 3.87–5.11)
RDW: 14.3 % (ref 11.5–15.5)
WBC: 18.9 10*3/uL — ABNORMAL HIGH (ref 4.0–10.5)

## 2016-06-14 LAB — DIC (DISSEMINATED INTRAVASCULAR COAGULATION) PANEL
D DIMER QUANT: 12.62 ug{FEU}/mL — AB (ref 0.00–0.50)
FIBRINOGEN: 437 mg/dL (ref 210–475)
INR: 1.17
PROTHROMBIN TIME: 15 s (ref 11.4–15.2)

## 2016-06-14 LAB — DIC (DISSEMINATED INTRAVASCULAR COAGULATION)PANEL
Platelets: 197 10*3/uL (ref 150–400)
Smear Review: NONE SEEN
aPTT: 29 seconds (ref 24–36)

## 2016-06-14 LAB — GLUCOSE, CAPILLARY
GLUCOSE-CAPILLARY: 94 mg/dL (ref 65–99)
GLUCOSE-CAPILLARY: 100 mg/dL — AB (ref 65–99)
Glucose-Capillary: 82 mg/dL (ref 65–99)
Glucose-Capillary: 102 mg/dL — ABNORMAL HIGH (ref 65–99)
Glucose-Capillary: 101 mg/dL — ABNORMAL HIGH (ref 65–99)

## 2016-06-14 LAB — POSTPARTUM HEMORRHAGE PROTOCOL (BB NOTIFICATION)

## 2016-06-14 LAB — PREPARE RBC (CROSSMATCH)

## 2016-06-14 SURGERY — Surgical Case
Anesthesia: Epidural

## 2016-06-14 MED ORDER — ALBUMIN HUMAN 5 % IV SOLN
INTRAVENOUS | Status: AC
Start: 2016-06-14 — End: 2016-06-14
  Filled 2016-06-14: qty 500

## 2016-06-14 MED ORDER — MORPHINE SULFATE (PF) 0.5 MG/ML IJ SOLN
INTRAMUSCULAR | Status: AC
  Filled 2016-06-14: qty 10

## 2016-06-14 MED ORDER — HYDROMORPHONE HCL 1 MG/ML IJ SOLN
0.2500 mg | INTRAMUSCULAR | Status: DC | PRN
  Administered 2016-06-14 (×4): 0.5 mg via INTRAVENOUS

## 2016-06-14 MED ORDER — HYDROMORPHONE HCL 1 MG/ML IJ SOLN
INTRAMUSCULAR | Status: AC
  Filled 2016-06-14: qty 1

## 2016-06-14 MED ORDER — SCOPOLAMINE 1 MG/3DAYS TD PT72: 1.0000 | MEDICATED_PATCH | Freq: Once | TRANSDERMAL | Status: DC

## 2016-06-14 MED ORDER — PHENYLEPHRINE 40 MCG/ML (10ML) SYRINGE FOR IV PUSH (FOR BLOOD PRESSURE SUPPORT)
80.0000 ug | PREFILLED_SYRINGE | INTRAVENOUS | Status: DC | PRN
  Administered 2016-06-14: 80 ug via INTRAVENOUS
  Filled 2016-06-14: qty 10

## 2016-06-14 MED ORDER — FENTANYL CITRATE (PF) 100 MCG/2ML IJ SOLN
INTRAMUSCULAR | Status: AC
  Filled 2016-06-14: qty 2

## 2016-06-14 MED ORDER — SODIUM BICARBONATE 8.4 % IV SOLN
INTRAVENOUS | Status: AC
  Filled 2016-06-14: qty 50

## 2016-06-14 MED ORDER — LIDOCAINE-EPINEPHRINE (PF) 2 %-1:200000 IJ SOLN
INTRAMUSCULAR | Status: AC
  Filled 2016-06-14: qty 20

## 2016-06-14 MED ORDER — PHENYLEPHRINE 40 MCG/ML (10ML) SYRINGE FOR IV PUSH (FOR BLOOD PRESSURE SUPPORT)
PREFILLED_SYRINGE | INTRAVENOUS | Status: AC
  Filled 2016-06-14: qty 10

## 2016-06-14 MED ORDER — PROMETHAZINE HCL 25 MG/ML IJ SOLN: 6.2500 mg | INTRAMUSCULAR | Status: DC | PRN

## 2016-06-14 MED ORDER — MEPERIDINE HCL 25 MG/ML IJ SOLN: 6.2500 mg | INTRAMUSCULAR | Status: DC | PRN

## 2016-06-14 MED ORDER — DIPHENHYDRAMINE HCL 25 MG PO CAPS
25.0000 mg | ORAL_CAPSULE | ORAL | Status: DC | PRN
Start: 2016-06-14 — End: 2016-06-17
  Filled 2016-06-14: qty 1

## 2016-06-14 MED ORDER — DIPHENHYDRAMINE HCL 50 MG/ML IJ SOLN: 12.5000 mg | INTRAMUSCULAR | Status: DC | PRN

## 2016-06-14 MED ORDER — LACTATED RINGERS IV SOLN
Freq: Once | INTRAVENOUS | Status: AC
  Administered 2016-06-14: 10:00:00 via INTRAUTERINE

## 2016-06-14 MED ORDER — NALBUPHINE HCL 10 MG/ML IJ SOLN: 5.0000 mg | Freq: Once | INTRAMUSCULAR | Status: DC | PRN

## 2016-06-14 MED ORDER — NALOXONE HCL 2 MG/2ML IJ SOSY
1.0000 ug/kg/h | PREFILLED_SYRINGE | INTRAVENOUS | Status: DC | PRN
  Filled 2016-06-14: qty 2

## 2016-06-14 MED ORDER — SCOPOLAMINE 1 MG/3DAYS TD PT72
MEDICATED_PATCH | TRANSDERMAL | Status: DC | PRN
  Administered 2016-06-14: 1 via TRANSDERMAL

## 2016-06-14 MED ORDER — MENTHOL 3 MG MT LOZG: 1.0000 | LOZENGE | OROMUCOSAL | Status: DC | PRN

## 2016-06-14 MED ORDER — EPHEDRINE 5 MG/ML INJ: 10.0000 mg | INTRAVENOUS | Status: DC | PRN

## 2016-06-14 MED ORDER — OXYTOCIN 10 UNIT/ML IJ SOLN
INTRAVENOUS | Status: DC | PRN
  Administered 2016-06-14: 40 [IU] via INTRAVENOUS

## 2016-06-14 MED ORDER — OXYTOCIN 10 UNIT/ML IJ SOLN
INTRAMUSCULAR | Status: AC
  Filled 2016-06-14: qty 4

## 2016-06-14 MED ORDER — SODIUM BICARBONATE 8.4 % IV SOLN
INTRAVENOUS | Status: DC | PRN
  Administered 2016-06-14: 6 mL via EPIDURAL
  Administered 2016-06-14 (×2): 5 mL via EPIDURAL
  Administered 2016-06-14 (×2): 4 mL via EPIDURAL

## 2016-06-14 MED ORDER — COCONUT OIL OIL: 1.0000 "application " | TOPICAL_OIL | Status: DC | PRN

## 2016-06-14 MED ORDER — SCOPOLAMINE 1 MG/3DAYS TD PT72
MEDICATED_PATCH | TRANSDERMAL | Status: AC
  Filled 2016-06-14: qty 1

## 2016-06-14 MED ORDER — MORPHINE SULFATE (PF) 0.5 MG/ML IJ SOLN
INTRAMUSCULAR | Status: DC | PRN
  Administered 2016-06-14: 4 mg via EPIDURAL
  Administered 2016-06-14 (×2): .5 mg via EPIDURAL

## 2016-06-14 MED ORDER — TETANUS-DIPHTH-ACELL PERTUSSIS 5-2.5-18.5 LF-MCG/0.5 IM SUSP: 0.5000 mL | Freq: Once | INTRAMUSCULAR | Status: DC

## 2016-06-14 MED ORDER — PHENYLEPHRINE 40 MCG/ML (10ML) SYRINGE FOR IV PUSH (FOR BLOOD PRESSURE SUPPORT)
PREFILLED_SYRINGE | INTRAVENOUS | Status: DC | PRN
  Administered 2016-06-14 (×4): 40 ug via INTRAVENOUS

## 2016-06-14 MED ORDER — SIMETHICONE 80 MG PO CHEW
80.0000 mg | CHEWABLE_TABLET | ORAL | Status: DC | PRN
  Administered 2016-06-15: 80 mg via ORAL

## 2016-06-14 MED ORDER — PHENYLEPHRINE 40 MCG/ML (10ML) SYRINGE FOR IV PUSH (FOR BLOOD PRESSURE SUPPORT)
80.0000 ug | PREFILLED_SYRINGE | INTRAVENOUS | Status: DC | PRN
  Filled 2016-06-14: qty 10

## 2016-06-14 MED ORDER — LIDOCAINE HCL (PF) 1 % IJ SOLN
INTRAMUSCULAR | Status: DC | PRN
  Administered 2016-06-14 (×2): 4 mL

## 2016-06-14 MED ORDER — CEFAZOLIN SODIUM-DEXTROSE 2-3 GM-% IV SOLR
INTRAVENOUS | Status: DC | PRN
  Administered 2016-06-14: 2 g via INTRAVENOUS

## 2016-06-14 MED ORDER — KETOROLAC TROMETHAMINE 30 MG/ML IJ SOLN: 30.0000 mg | Freq: Four times a day (QID) | INTRAMUSCULAR | Status: AC | PRN

## 2016-06-14 MED ORDER — NALOXONE HCL 0.4 MG/ML IJ SOLN: 0.4000 mg | INTRAMUSCULAR | Status: DC | PRN

## 2016-06-14 MED ORDER — ALBUMIN HUMAN 5 % IV SOLN
INTRAVENOUS | Status: DC | PRN
  Administered 2016-06-14 (×2): via INTRAVENOUS

## 2016-06-14 MED ORDER — ONDANSETRON HCL 4 MG/2ML IJ SOLN
4.0000 mg | Freq: Three times a day (TID) | INTRAMUSCULAR | Status: DC | PRN
Start: 2016-06-14 — End: 2016-06-17

## 2016-06-14 MED ORDER — CEFAZOLIN SODIUM-DEXTROSE 2-4 GM/100ML-% IV SOLN
INTRAVENOUS | Status: AC
  Filled 2016-06-14: qty 100

## 2016-06-14 MED ORDER — SIMETHICONE 80 MG PO CHEW
80.0000 mg | CHEWABLE_TABLET | Freq: Three times a day (TID) | ORAL | Status: DC
  Administered 2016-06-15 – 2016-06-17 (×5): 80 mg via ORAL
  Filled 2016-06-14 (×7): qty 1

## 2016-06-14 MED ORDER — NALBUPHINE HCL 10 MG/ML IJ SOLN: 5.0000 mg | INTRAMUSCULAR | Status: DC | PRN

## 2016-06-14 MED ORDER — ZOLPIDEM TARTRATE 5 MG PO TABS: 5.0000 mg | ORAL_TABLET | Freq: Every evening | ORAL | Status: DC | PRN

## 2016-06-14 MED ORDER — ONDANSETRON HCL 4 MG/2ML IJ SOLN
INTRAMUSCULAR | Status: DC | PRN
  Administered 2016-06-14: 4 mg via INTRAVENOUS

## 2016-06-14 MED ORDER — ACETAMINOPHEN 325 MG PO TABS
650.0000 mg | ORAL_TABLET | ORAL | Status: DC | PRN
  Administered 2016-06-15 – 2016-06-17 (×6): 650 mg via ORAL
  Filled 2016-06-14 (×7): qty 2

## 2016-06-14 MED ORDER — LACTATED RINGERS IV SOLN
INTRAVENOUS | Status: DC | PRN
  Administered 2016-06-14: 19:00:00 via INTRAVENOUS

## 2016-06-14 MED ORDER — SODIUM CHLORIDE 0.9 % IR SOLN
Status: DC | PRN
  Administered 2016-06-14: 1000 mL via INTRAVESICAL

## 2016-06-14 MED ORDER — PRENATAL MULTIVITAMIN CH
1.0000 | ORAL_TABLET | Freq: Every day | ORAL | Status: DC
  Administered 2016-06-16 – 2016-06-17 (×2): 1 via ORAL
  Filled 2016-06-14 (×3): qty 1

## 2016-06-14 MED ORDER — SIMETHICONE 80 MG PO CHEW
80.0000 mg | CHEWABLE_TABLET | ORAL | Status: DC
  Administered 2016-06-15 – 2016-06-16 (×3): 80 mg via ORAL
  Filled 2016-06-14 (×4): qty 1

## 2016-06-14 MED ORDER — IBUPROFEN 600 MG PO TABS
600.0000 mg | ORAL_TABLET | Freq: Four times a day (QID) | ORAL | Status: DC
  Administered 2016-06-15 – 2016-06-17 (×9): 600 mg via ORAL
  Filled 2016-06-14 (×8): qty 1

## 2016-06-14 MED ORDER — SODIUM CHLORIDE 0.9% FLUSH: 3.0000 mL | INTRAVENOUS | Status: DC | PRN

## 2016-06-14 MED ORDER — LACTATED RINGERS IV SOLN
INTRAVENOUS | Status: DC | PRN
  Administered 2016-06-14 (×2): via INTRAVENOUS

## 2016-06-14 MED ORDER — LACTATED RINGERS IV SOLN: 500.0000 mL | Freq: Once | INTRAVENOUS | Status: DC

## 2016-06-14 MED ORDER — FENTANYL 2.5 MCG/ML BUPIVACAINE 1/10 % EPIDURAL INFUSION (WH - ANES)
14.0000 mL/h | INTRAMUSCULAR | Status: DC | PRN
  Administered 2016-06-14 (×2): 14 mL/h via EPIDURAL
  Filled 2016-06-14 (×2): qty 100

## 2016-06-14 MED ORDER — ONDANSETRON HCL 4 MG/2ML IJ SOLN
INTRAMUSCULAR | Status: AC
  Filled 2016-06-14: qty 2

## 2016-06-14 MED ORDER — OXYTOCIN 40 UNITS IN LACTATED RINGERS INFUSION - SIMPLE MED
2.5000 [IU]/h | INTRAVENOUS | Status: AC
  Administered 2016-06-15: 2.5 [IU]/h via INTRAVENOUS
  Filled 2016-06-14: qty 1000

## 2016-06-14 MED ORDER — FENTANYL CITRATE (PF) 100 MCG/2ML IJ SOLN
INTRAMUSCULAR | Status: DC | PRN
  Administered 2016-06-14: 100 ug via EPIDURAL

## 2016-06-14 MED ORDER — KETOROLAC TROMETHAMINE 30 MG/ML IJ SOLN
30.0000 mg | Freq: Four times a day (QID) | INTRAMUSCULAR | Status: AC | PRN
  Administered 2016-06-15 (×2): 30 mg via INTRAVENOUS
  Filled 2016-06-14 (×2): qty 1

## 2016-06-14 MED ORDER — DIBUCAINE 1 % RE OINT: 1.0000 "application " | TOPICAL_OINTMENT | RECTAL | Status: DC | PRN

## 2016-06-14 MED ORDER — CEFAZOLIN SODIUM-DEXTROSE 2-4 GM/100ML-% IV SOLN
INTRAVENOUS | Status: AC
Start: 2016-06-14 — End: 2016-06-14
  Filled 2016-06-14: qty 100

## 2016-06-14 MED ORDER — SODIUM CHLORIDE 0.9 % IV SOLN: Freq: Once | INTRAVENOUS | Status: DC

## 2016-06-14 MED ORDER — ONDANSETRON HCL 4 MG/2ML IJ SOLN
INTRAMUSCULAR | Status: AC
Start: 2016-06-14 — End: 2016-06-14
  Filled 2016-06-14: qty 2

## 2016-06-14 MED ORDER — SENNOSIDES-DOCUSATE SODIUM 8.6-50 MG PO TABS
2.0000 | ORAL_TABLET | ORAL | Status: DC
  Administered 2016-06-15 – 2016-06-16 (×3): 2 via ORAL
  Filled 2016-06-14 (×3): qty 2

## 2016-06-14 MED ORDER — ACETAMINOPHEN 10 MG/ML IV SOLN: 1000.0000 mg | Freq: Once | INTRAVENOUS | Status: DC | PRN

## 2016-06-14 MED ORDER — DIPHENHYDRAMINE HCL 25 MG PO CAPS
25.0000 mg | ORAL_CAPSULE | Freq: Four times a day (QID) | ORAL | Status: DC | PRN
Start: 2016-06-14 — End: 2016-06-17

## 2016-06-14 MED ORDER — WITCH HAZEL-GLYCERIN EX PADS: 1.0000 "application " | MEDICATED_PAD | CUTANEOUS | Status: DC | PRN

## 2016-06-14 MED ORDER — LACTATED RINGERS IV SOLN
INTRAVENOUS | Status: DC | PRN
  Administered 2016-06-14: 18:00:00 via INTRAVENOUS

## 2016-06-14 MED ORDER — LACTATED RINGERS IV SOLN
INTRAVENOUS | Status: DC
  Administered 2016-06-15: 01:00:00 via INTRAVENOUS

## 2016-06-14 SURGICAL SUPPLY — 41 items
BENZOIN TINCTURE PRP APPL 2/3 (GAUZE/BANDAGES/DRESSINGS) ×3 IMPLANT
CHLORAPREP W/TINT 26ML (MISCELLANEOUS) ×3 IMPLANT
CLAMP CORD UMBIL (MISCELLANEOUS) ×3 IMPLANT
CLIP FILSHIE TUBAL LIGA STRL (Clip) ×3 IMPLANT
CLOSURE STERI STRIP 1/2 X4 (GAUZE/BANDAGES/DRESSINGS) ×3 IMPLANT
CLOTH BEACON ORANGE TIMEOUT ST (SAFETY) ×3 IMPLANT
COTTONBALL LRG STERILE PKG (GAUZE/BANDAGES/DRESSINGS) ×9 IMPLANT
COVER LIGHT HANDLE  1/PK (MISCELLANEOUS) ×2
COVER LIGHT HANDLE 1/PK (MISCELLANEOUS) ×1 IMPLANT
DRAIN JACKSON PRT FLT 7MM (DRAIN) IMPLANT
DRSG OPSITE POSTOP 4X10 (GAUZE/BANDAGES/DRESSINGS) ×3 IMPLANT
ELECT REM PT RETURN 9FT ADLT (ELECTROSURGICAL) ×3
ELECTRODE REM PT RTRN 9FT ADLT (ELECTROSURGICAL) ×1 IMPLANT
EVACUATOR SILICONE 100CC (DRAIN) IMPLANT
EXTRACTOR VACUUM M CUP 4 TUBE (SUCTIONS) IMPLANT
EXTRACTOR VACUUM M CUP 4' TUBE (SUCTIONS)
GLOVE BIO SURGEON STRL SZ7 (GLOVE) ×6 IMPLANT
GLOVE BIOGEL PI IND STRL 7.0 (GLOVE) ×3 IMPLANT
GLOVE BIOGEL PI INDICATOR 7.0 (GLOVE) ×6
GOWN STRL REUS W/TWL LRG LVL3 (GOWN DISPOSABLE) ×6 IMPLANT
HEMOSTAT ARISTA ABSORB 3G PWDR (MISCELLANEOUS) ×3 IMPLANT
KIT ABG SYR 3ML LUER SLIP (SYRINGE) ×3 IMPLANT
NEEDLE HYPO 25X5/8 SAFETYGLIDE (NEEDLE) ×6 IMPLANT
NS IRRIG 1000ML POUR BTL (IV SOLUTION) ×3 IMPLANT
PACK C SECTION WH (CUSTOM PROCEDURE TRAY) ×3 IMPLANT
PAD ABD 7.5X8 STRL (GAUZE/BANDAGES/DRESSINGS) ×3 IMPLANT
PAD ABD 8X10 STRL (GAUZE/BANDAGES/DRESSINGS) ×3 IMPLANT
PAD OB MATERNITY 4.3X12.25 (PERSONAL CARE ITEMS) ×6 IMPLANT
PENCIL SMOKE EVAC W/HOLSTER (ELECTROSURGICAL) ×3 IMPLANT
RTRCTR C-SECT PINK 25CM LRG (MISCELLANEOUS) ×3 IMPLANT
SPONGE LAP 18X18 X RAY DECT (DISPOSABLE) ×9 IMPLANT
SUT PLAIN 2 0 (SUTURE) ×2
SUT PLAIN ABS 2-0 CT1 27XMFL (SUTURE) ×1 IMPLANT
SUT VIC AB 0 CTX 36 (SUTURE) ×10
SUT VIC AB 0 CTX36XBRD ANBCTRL (SUTURE) ×5 IMPLANT
SUT VIC AB 4-0 KS 27 (SUTURE) ×3 IMPLANT
SYR 10ML LL (SYRINGE) ×3 IMPLANT
SYR KIT LINE DRAW 1CC W/FILTR (LINER) ×3 IMPLANT
TAPE HYPAFIX 4 X10 (GAUZE/BANDAGES/DRESSINGS) ×3 IMPLANT
TOWEL OR 17X24 6PK STRL BLUE (TOWEL DISPOSABLE) ×3 IMPLANT
TRAY FOLEY BAG SILVER LF 14FR (SET/KITS/TRAYS/PACK) ×3 IMPLANT

## 2016-06-14 NOTE — Anesthesia Postprocedure Evaluation (Signed)
Anesthesia Post Note  Patient: [name]  Procedure(s) Performed: Procedure(s) (LRB): CESAREAN SECTION (N/A)     Patient location during evaluation: PACU Anesthesia Type: Epidural Level of consciousness: awake and alert Pain management: pain level controlled Vital Signs Assessment: post-procedure vital signs reviewed and stable Respiratory status: spontaneous breathing, nonlabored ventilation and respiratory function stable Cardiovascular status: stable Postop Assessment: no headache, no backache and epidural receding Anesthetic complications: no    Last Vitals:  Vitals:   06/14/16 2120 06/14/16 2130  BP:  (!) 136/59  Pulse: (!) 116 (!) 114  Resp:    Temp:      Last Pain:  Vitals:   06/14/16 2115  TempSrc: Oral  PainSc:    Pain Goal: Patients Stated Pain Goal: 2 (06/12/16 1104)               Ryan P Ellender

## 2016-06-14 NOTE — Progress Notes (Signed)
[name] is a 36 y.o. G3P2002 at 4636w3d  Pt had 8 minute fetal bradycardia.  Once recovered 190 baseline and decreased variability. C/S is considered urgent at this time due to fetal indications.  Anesthesia aware.

## 2016-06-14 NOTE — Progress Notes (Signed)
Called for patient to OR 1

## 2016-06-14 NOTE — Progress Notes (Signed)
Subjective: Patient comfortable with epidural.   Objective: Vitals:   06/14/16 0326 06/14/16 0331 06/14/16 0336 06/14/16 0341  BP: (!) 120/53 (!) 122/50 (!) 117/56 (!) 107/46  Pulse: 90 95 95 87  Resp: 18     Temp: 97.6 F (36.4 C)     TempSrc: Oral     SpO2:      Weight:      Height:       FHT:  FHR: 135 bpm, variability: moderate,  accelerations:  Present,  decelerations:  Absent UC:   regular, every 2 minutes SVE:   Dilation: 5 Effacement (%): 70, 80 Station: [place], -3 Exam by:: Shelia MediaMarie Williams,CNM Pitocin @ 12 mu/min  Labs: Lab Results  Component Value Date   WBC 10.3 06/12/2016   HGB 13.4 06/12/2016   HCT 39.0 06/12/2016   MCV 95.6 06/12/2016   PLT 272 06/12/2016    Assessment / Plan: IUP at term. IOL for varix. GBS neg.  Dr. Emelda FearFerguson at bedside. Discussed risks and benefits of AROM for labor augmentation with patient. Patient agreeable to procedure. Dr. Emelda FearFerguson attempted AROM, bloody show noted with questionable rupture.  Continue pitocin. Will reattempt AROM if needed. Anticipate NSVD.  Cleone SlimCaroline Alvena Kiernan SNM 06/14/2016, 4:29 AM

## 2016-06-14 NOTE — Progress Notes (Signed)
[name] Hull is a 36 y.o. G3P2002 at 2973w3d  admitted for induction of labor due to umbilical cord varix as per Maternal Fetal Medicine..  Subjective: Efforts to AROM at 3:30 am must have been unsuccessful, as no fluid obtained since then.  Arom efforts to be repeated now, and are succesful  the Vertex remains very high, somewhat asynclytic, with vertex in luq,  Contractions are q 2 min by EFM  Objective: BP 104/62   Pulse 93   Temp 97.6 F (36.4 C) (Oral)   Resp 18   Ht 5\' 4"  (1.626 m)   Wt 92.1 kg (203 lb)   LMP 09/26/2015   SpO2 99%   BMI 34.84 kg/m  No intake/output data recorded. No intake/output data recorded.  FHT:  FHR: 145 bpm, variability: moderate,  accelerations:  Present,  decelerations:  Absent UC:   regular, every 2-3 minutes SVE:   Dilation: 5 Effacement (%): 70, 80 Station: [place], -3 Exam by:: Shelia MediaMarie Williams,CNM By my exam, she's 5/ long/ -3 or higher, presenting part in lower uterine segment , but out of the pelvis AROM- Clear, generous fluid. Labs: Lab Results  Component Value Date   WBC 10.3 06/12/2016   HGB 13.4 06/12/2016   HCT 39.0 06/12/2016   MCV 95.6 06/12/2016   PLT 272 06/12/2016    Assessment / Plan: Induction of labor due to umbilical cord varix,  progressing well on pitocin  Labor: on pitocin, with frequent regular contractions. Preeclampsia:   Fetal Wellbeing:  Category I Pain Control:  Epidural I/D:  n/a Anticipated MOD:  NSVD  Rolfe Hartsell V 06/14/2016, 6:10 AM

## 2016-06-14 NOTE — Anesthesia Preprocedure Evaluation (Signed)
Anesthesia Evaluation  Patient identified by MRN, date of birth, ID band Patient awake    Reviewed: Allergy & Precautions, NPO status , Patient's Chart, lab work & pertinent test results  Airway Mallampati: II  TM Distance: >3 FB Neck ROM: Full    Dental no notable dental hx.    Pulmonary neg pulmonary ROS,    Pulmonary exam normal breath sounds clear to auscultation       Cardiovascular hypertension, Normal cardiovascular exam Rhythm:Regular Rate:Normal     Neuro/Psych negative neurological ROS  negative psych ROS   GI/Hepatic negative GI ROS, Neg liver ROS,   Endo/Other  diabetes, Gestational  Renal/GU negative Renal ROS     Musculoskeletal negative musculoskeletal ROS (+)   Abdominal   Peds  Hematology negative hematology ROS (+)   Anesthesia Other Findings   Reproductive/Obstetrics (+) Pregnancy                             Anesthesia Physical Anesthesia Plan  ASA: II  Anesthesia Plan: Epidural   Post-op Pain Management:    Induction:   PONV Risk Score and Plan:   Airway Management Planned:   Additional Equipment:   Intra-op Plan:   Post-operative Plan:   Informed Consent: I have reviewed the patients History and Physical, chart, labs and discussed the procedure including the risks, benefits and alternatives for the proposed anesthesia with the patient or authorized representative who has indicated his/her understanding and acceptance.     Plan Discussed with:   Anesthesia Plan Comments:         Anesthesia Quick Evaluation

## 2016-06-14 NOTE — Transfer of Care (Signed)
Immediate Anesthesia Transfer of Care Note  Patient: [name] Hull  Procedure(s) Performed: Procedure(s): CESAREAN SECTION (N/A)  Patient Location: PACU  Anesthesia Type:Epidural  Level of Consciousness: awake  Airway & Oxygen Therapy: Patient Spontanous Breathing  Post-op Assessment: Report given to RN and Post -op Vital signs reviewed and stable  Post vital signs: stable  Last Vitals:  Vitals:   06/14/16 1750 06/14/16 1755  BP: 127/60 121/78  Pulse: 88 93  Resp:    Temp:  37.4 C    Last Pain:  Vitals:   06/14/16 1755  TempSrc: Oral  PainSc:       Patients Stated Pain Goal: 2 (06/12/16 1104)  Complications: No apparent anesthesia complications

## 2016-06-14 NOTE — Progress Notes (Signed)
[name] is a 36 y.o. G3P2002 at 3010w3d with indcution for Varix. Subjective:   Objective: BP (!) 113/46   Pulse 100   Temp 99.3 F (37.4 C) (Oral)   Resp 20   Ht 5\' 4"  (1.626 m)   Wt 203 lb (92.1 kg)   LMP 09/26/2015   SpO2 99%   BMI 34.84 kg/m  No intake/output data recorded. Total I/O In: -  Out: 250 [Urine:250]  FHT:  FHR: 160 bpm, variability: moderate,  accelerations:  Abscent,  decelerations:  Present variables with pushing. UC:   regular, every 3 minutes  10/100/-3 (caput is -2) Labs: Lab Results  Component Value Date   WBC 10.3 06/12/2016   HGB 13.4 06/12/2016   HCT 39.0 06/12/2016   MCV 95.6 06/12/2016   PLT 272 06/12/2016    Assessment / Plan: Arrest of decent  Labor: arrest of descent.  The risks of cesarean section discussed with the patient included but were not limited to: bleeding which may require transfusion or reoperation; infection which may require antibiotics; injury to bowel, bladder, ureters or other surrounding organs; injury to the fetus; need for additional procedures including hysterectomy in the event of a life-threatening hemorrhage; placental abnormalities wth subsequent pregnancies, incisional problems, thromboembolic phenomenon and other postoperative/anesthesia complications. The patient concurred with the proposed plan, giving informed written consent for the procedure.   Patient desires permanent sterilization.  Other reversible forms of contraception were discussed with patient; she declines all other modalities. Risks of procedure discussed with patient including but not limited to: risk of regret, permanence of method, bleeding, infection, injury to surrounding organs and need for additional procedures.  Failure risk of 0.5-1% with increased risk of ectopic gestation if pregnancy occurs was also discussed with patient.    Patient verbalized understanding of these risks and wants to proceed with sterilization.  Written  informed consent obtained.  To OR when ready.  Pt uncomfortable, will dose epidural some in room.  Interpreter Raquel helped with interpreting.  Elsie LincolnKelly Lakea Mittelman 06/14/2016, 4:31 PM

## 2016-06-14 NOTE — Anesthesia Procedure Notes (Signed)
Epidural Patient location during procedure: OB  Staffing Anesthesiologist: Tannor Pyon Performed: anesthesiologist   Preanesthetic Checklist Completed: patient identified, pre-op evaluation, timeout performed, IV checked, risks and benefits discussed and monitors and equipment checked  Epidural Patient position: sitting Prep: site prepped and draped and DuraPrep Patient monitoring: heart rate, continuous pulse ox and blood pressure Approach: midline Location: L3-L4 Injection technique: LOR air and LOR saline  Needle:  Needle type: Tuohy  Needle gauge: 17 G Needle length: 9 cm Needle insertion depth: 6 cm Catheter type: closed end flexible Catheter size: 19 Gauge Catheter at skin depth: 11 cm Test dose: negative  Assessment Sensory level: T8 Events: blood not aspirated, injection not painful, no injection resistance, negative IV test and no paresthesia  Additional Notes Reason for block:procedure for pain     

## 2016-06-14 NOTE — Progress Notes (Signed)
   06/14/16 1714  Vital Signs  BP (!) 105/43  Pulse Rate (!) 108  Resp 16   80mcg phenylephrine IVP given

## 2016-06-14 NOTE — Progress Notes (Signed)
Subjective: Patient feeling regular contractions. Requesting IV pain medication  Objective: Vitals:   06/13/16 2101 06/13/16 2133 06/13/16 2201 06/13/16 2301  BP: 139/70 (!) 125/55 131/65 116/72  Pulse: 84 84 86 86  Resp:      Temp:      TempSrc:      SpO2:      Weight:      Height:       FHT:  FHR: 130 bpm, variability: moderate,  accelerations:  Present,  decelerations:  Absent UC:   regular, every 2-3 minutes SVE:   Dilation: 1.5 Effacement (%): 50 Station: Ballotable Exam by:: dr Omer [place] Pitocin @ 12 mu/min  Labs: Lab Results  Component Value Date   WBC 10.3 06/12/2016   HGB 13.4 06/12/2016   HCT 39.0 06/12/2016   MCV 95.6 06/12/2016   PLT 272 06/12/2016    Assessment / Plan: IUP at term. IOL for varix. GBS neg.   Vertex, oblique presentation confirmed with bedside u/s. Dr. Emelda FearFerguson at bedside, discussed with patient foley bulb placement for cervical ripening. Patient agreeable. Foley bulb placed by Dr. Emelda FearFerguson without difficulty and patient tolerated procedure well.   Continue pitocin, await foley to fall out. Anticipate AROM when foley out.   [name] Hull SNM 06/14/2016, 12:26 AM

## 2016-06-14 NOTE — Progress Notes (Signed)
[name] Hull is a 36 y.o. G3P2002 at 8447w3d with umbilical varix Subjective: Pt not feeling very much.  Epidural very dense  Objective: BP 117/62   Pulse (!) 102   Temp 98.7 F (37.1 C) (Oral)   Resp 18   Ht 5\' 4"  (1.626 m)   Wt 203 lb (92.1 kg)   LMP 09/26/2015   SpO2 99%   BMI 34.84 kg/m  No intake/output data recorded. Total I/O In: -  Out: 250 [Urine:250]  FHT:  FHR: 150 bpm, variability: moderate,  accelerations:  Present,  decelerations:  Present few variables UC:   irregular, every 2-6 minutes SVE:   10/80/-2 Labs: Lab Results  Component Value Date   WBC 10.3 06/12/2016   HGB 13.4 06/12/2016   HCT 39.0 06/12/2016   MCV 95.6 06/12/2016   PLT 272 06/12/2016    Assessment / Plan: Augmentation of labor, progressing well  Brought down baby 1 cm with weak contractions on pitocin.  Will turn down epidural and attempt to push in 1 hour.  [name] Hull 06/14/2016, 2:03 PM

## 2016-06-14 NOTE — Op Note (Signed)
[name] Hull PROCEDURE DATE: 06/12/2016 - 06/14/2016  PREOPERATIVE DIAGNOSIS: Intrauterine pregnancy at  6590w3d weeks gestation  POSTOPERATIVE DIAGNOSIS: The same  PROCEDURE:    Low Transverse Cesarean Section  SURGEON:  Dr. Elsie LincolnKelly Leggett                       Dr. Ernestina PennaNicholas Schenk   INDICATIONS: [name] SickleDiana Zavala Hull is a 36 y.o. [phone] at 3790w3d who was being induced because of umbilical varix she has made it to complete and pushed with little progress and infant remains at -3 station.  The risks of cesarean section discussed with the patient included but were not limited to: bleeding which may require transfusion or reoperation; infection which may require antibiotics; injury to bowel, bladder, ureters or other surrounding organs; injury to the fetus; need for additional procedures including hysterectomy in the event of a life-threatening hemorrhage; placental abnormalities wth subsequent pregnancies, incisional problems, thromboembolic phenomenon and other postoperative/anesthesia complications. The patient concurred with the proposed plan, giving informed written consent for the procedure.    FINDINGS:  Viable female  infant in cephalic presentation, 8 and 8 Apgars, weight to be determined in 1 hour, clear amniotic fluid.  Intact placenta, three vessel cord.  Grossly normal uterus, ovaries and fallopian tubes. .   ANESTHESIA:    Epidural   ESTIMATED BLOOD LOSS: 1500  SPECIMENS: Placenta sent to pathology  COMPLICATIONS: None immediate  PROCEDURE IN DETAIL:  The patient received intravenous antibiotics and had sequential compression devices applied to her lower extremities.  Epidural anesthesia was dosed up to surgical level and was found to be adequate. She was then placed in a dorsal supine position with a leftward tilt, and prepped and draped in a sterile manner.  A foley catheter was placed into her bladder and attached to constant gravity.  After an adequate timeout was  performed, a Pfannenstiel skin incision was made with scalpel and carried through to the underlying layer of fascia. The fascia was incised in the midline and this incision was extended bilaterally using the Mayo scissors. Kocher clamps were applied to the superior aspect of the fascial incision and the underlying rectus muscles were dissected off bluntly. A similar process was carried out on the inferior aspect of the facial incision. The rectus muscles were separated in the midline bluntly and the peritoneum was entered bluntly.   A transverse hysterotomy was made with a scalpel and extended bilaterally bluntly. The bladder blade was then removed. The infant was successfully delivered, and cord was clamped and cut and infant was handed over to awaiting neonatology team. Uterine massage was then administered and the placenta delivered intact with three-vessel cord. The uterus was exteriorized. The uterus was cleared of clot and debris.  It was noted that there was significant extension of the hysterotomy on the right. Multiple ring forceps and Alice clamps were used to find the apex of the incision 0 - monocryl was used to close the incision in locking fasion.  Multiple figure of eights were used to obtain hemostasis at both apices of the hysterotomy site. The extension on the right was more severe.  Attention was turned to the fallopian tubes and they were clamped with filsche clips b/l. The uterus was placed back into the abdomen. Several more figure of eight sutures were placed to obtain hemostasis at the right apex of the hysterotomy. Arista was applied  The peritoneum and rectus muscles were noted to be hemostatic. A single interupted suture  of 2-0 vicryl was used to closed the rectus. The subcutaneus tissue was copiously irrigated.Plan gut was used to close the subcutaneus space.  The skin was closed with 4-0 Vicryl in a subcuticular fashion.    A code Hemmorhage was called in the OR. The immediate post OP  hemoglobin was noted to be 10.2. No blood was given.   Pt tolerated the procedure will.  All counts were correct x2.  Pt went to the recovery room in stable condition.  Ernestina Penna, MD 06/14/16  9:00 PM

## 2016-06-14 NOTE — Progress Notes (Signed)
[name] Hull is a 36 y.o. G3P2002 at 7136w3d induced for varix Subjective:   Objective: BP 117/73   Pulse 83   Temp 98.7 F (37.1 C) (Oral)   Resp 18   Ht 5\' 4"  (1.626 m)   Wt 203 lb (92.1 kg)   LMP 09/26/2015   SpO2 99%   BMI 34.84 kg/m  No intake/output data recorded. Total I/O In: -  Out: 250 [Urine:250]  FHT:  140 baseline, mod variability, early and variable decelerations, 10 beat accelerations UC:   regular, every 2 minutes SVE:   Dilation: 9 Effacement (%): 80 Station: -3 Exam by:: Dr Penne LashLeggett  Labs: Lab Results  Component Value Date   WBC 10.3 06/12/2016   HGB 13.4 06/12/2016   HCT 39.0 06/12/2016   MCV 95.6 06/12/2016   PLT 272 06/12/2016    Assessment / Plan: Augmentation of labor, progressing well  Made 3 cm change in 2 hours  MVUs 140-150 per RN  Labor: Progressing normally Preeclampsia:  no signs or symptoms of toxicity Fetal Wellbeing:  Category II Pain Control:  Epidural I/D:  n/a Anticipated MOD:  NSVD  [name] Hull 06/14/2016, 11:29 AM

## 2016-06-14 NOTE — Progress Notes (Signed)
[name] is a 36 y.o. G3P2002 at 3810w3d with Varix being induced. Subjective:   Objective: BP (!) 123/57   Pulse 94   Temp 98.7 F (37.1 C) (Oral)   Resp 18   Ht 5\' 4"  (1.626 m)   Wt 203 lb (92.1 kg)   LMP 09/26/2015   SpO2 99%   BMI 34.84 kg/m  No intake/output data recorded. Total I/O In: -  Out: 250 [Urine:250]  FHT:  FHR: 150 bpm, variability: moderate,  accelerations:  Present,  decelerations:  Present some early and variables UC:   IUPC not tracing contractions.  Replaced SVE:   10/100/-3--head well applied and coming into pelvis. Labs: Lab Results  Component Value Date   WBC 10.3 06/12/2016   HGB 13.4 06/12/2016   HCT 39.0 06/12/2016   MCV 95.6 06/12/2016   PLT 272 06/12/2016    Assessment / Plan: Induction of labor due to varix,  progressing well on pitocin  Labor: Progressing normally Preeclampsia:  no signs or symptoms of toxicity Fetal Wellbeing:  Category II Pain Control:  Epidural I/D:  n/a Anticipated MOD:  NSVD  Elsie LincolnKelly Zechariah Bissonnette 06/14/2016, 12:35 PM

## 2016-06-14 NOTE — Progress Notes (Signed)
   06/14/16 1700 06/14/16 1702  Vital Signs  BP (!) 65/30 (!) 76/58  Pulse Rate 67 91  Resp 16 14   80mcg phenylephrine given IVP

## 2016-06-14 NOTE — Progress Notes (Signed)
Patient ID: [name] Hull, female   DOB: 10/27/1980, 36 y.o.   MRN: [id number] Hurting more with contractions  Vitals:   06/14/16 0308 06/14/16 0310 06/14/16 0311 06/14/16 0315  BP: (!) 127/47 (!) 127/47 (!) 124/44 106/65  Pulse: 96 96 96 (!) 108  Resp:      Temp:      TempSrc:      SpO2:      Weight:      Height:       FHR reactive UCs every 2-4 min  Dilation: 5 Effacement (%): 70, 80 Cervical Position: Posterior Station: Web designerBallotable, -3 Presentation: Vertex Exam by:: Shelia MediaMarie Nesa Distel,CNM  Vertex is presenting and engaged at -3, only slightly ballotable  Foley out.   Will get epidural then AROM Dr Emelda FearFerguson updated

## 2016-06-15 ENCOUNTER — Encounter (HOSPITAL_COMMUNITY): Payer: Self-pay | Admitting: Obstetrics & Gynecology

## 2016-06-15 LAB — CBC
HCT: 31.8 % — ABNORMAL LOW (ref 36.0–46.0)
HEMATOCRIT: 29.5 % — AB (ref 36.0–46.0)
HEMOGLOBIN: 10.3 g/dL — AB (ref 12.0–15.0)
Hemoglobin: 11.1 g/dL — ABNORMAL LOW (ref 12.0–15.0)
MCH: 32.8 pg (ref 26.0–34.0)
MCH: 33.1 pg (ref 26.0–34.0)
MCHC: 34.9 g/dL (ref 30.0–36.0)
MCHC: 34.9 g/dL (ref 30.0–36.0)
MCV: 93.9 fL (ref 78.0–100.0)
MCV: 94.9 fL (ref 78.0–100.0)
PLATELETS: 190 10*3/uL (ref 150–400)
Platelets: 191 10*3/uL (ref 150–400)
RBC: 3.14 MIL/uL — ABNORMAL LOW (ref 3.87–5.11)
RBC: 3.35 MIL/uL — ABNORMAL LOW (ref 3.87–5.11)
RDW: 14.1 % (ref 11.5–15.5)
RDW: 14.3 % (ref 11.5–15.5)
WBC: 15.5 10*3/uL — ABNORMAL HIGH (ref 4.0–10.5)
WBC: 18.6 10*3/uL — ABNORMAL HIGH (ref 4.0–10.5)

## 2016-06-15 LAB — GLUCOSE, CAPILLARY: Glucose-Capillary: 102 mg/dL — ABNORMAL HIGH (ref 65–99)

## 2016-06-15 MED ORDER — OXYCODONE HCL 5 MG PO TABS
5.0000 mg | ORAL_TABLET | ORAL | Status: DC | PRN
  Administered 2016-06-16 – 2016-06-17 (×6): 5 mg via ORAL
  Filled 2016-06-15 (×5): qty 1

## 2016-06-15 MED ORDER — OXYCODONE HCL 5 MG PO TABS
10.0000 mg | ORAL_TABLET | ORAL | Status: DC | PRN
  Administered 2016-06-15 – 2016-06-17 (×2): 10 mg via ORAL
  Filled 2016-06-15 (×2): qty 2

## 2016-06-15 NOTE — Addendum Note (Signed)
Addendum  created 06/15/16 0804 by Shanon PayorGregory, Mattthew Ziomek M, CRNA   Sign clinical note

## 2016-06-15 NOTE — Progress Notes (Addendum)
POSTPARTUM PROGRESS NOTE  Post Op/Partum Day #1  Subjective:  [name] Hull is a 36 y.o. G3P3003 2124w3d s/p LTCS.  No acute events overnight.  Pt denies problems with ambulating, voiding or po intake.  She denies nausea or vomiting.  Pain is well controlled.  She has had flatus. She has not had bowel movement.  Lochia Minimal.   Objective: Blood pressure (!) 116/53, pulse (!) 105, temperature 98.1 F (36.7 C), temperature source Oral, resp. rate 18, height 5\' 4"  (1.626 m), weight 92.1 kg (203 lb), last menstrual period 09/26/2015, SpO2 95 %, unknown if currently breastfeeding.  Physical Exam:  General: alert, cooperative and no distress Lochia:normal flow Chest: CTAB Heart: RRR no m/r/g Abdomen: +BS, soft, nontender, incision is clean/dry/intact, honeycomb in place Uterine Fundus: firm DVT Evaluation: No calf swelling or tenderness Extremities: no LE edema   Recent Labs  06/15/16 0003 06/15/16 0531  HGB 11.1* 10.3*  HCT 31.8* 29.5*    Assessment/Plan:  ASSESSMENT: [name] Hull is a 36 y.o. G3P3003 4824w3d s/p LTCS.  Plan for discharge tomorrow   LOS: 3 days   Howard PouchLauren Feng, MD PGY-1 [facility]  06/15/2016, 8:49 AM   OB FELLOW POSTPARTUM PROGRESS NOTE ATTESTATION  I have seen and examined this patient and agree with above documentation in the resident's note.   Jen MowElizabeth Lynzi Meulemans, DO OB Fellow 3:15 PM

## 2016-06-15 NOTE — Progress Notes (Signed)
MOB was referred for history of depression/anxiety. * Referral screened out by Clinical Social Worker because none of the following criteria appear to apply: ~ History of anxiety/depression during this pregnancy, or of post-partum depression. ~ Diagnosis of anxiety and/or depression within last 3 years OR * MOB's symptoms currently being treated with medication and/or therapy.  CSW completed chart review and MOB's anxiety/depression was over 10 years ago.   Please contact the Clinical Social Worker if needs arise, or if MOB requests.  Blaine HamperAngel Boyd-Gilyard, MSW, LCSW Clinical Social Work [phone]

## 2016-06-15 NOTE — Anesthesia Postprocedure Evaluation (Signed)
Anesthesia Post Note  Patient: [name] Hull  Procedure(s) Performed: Procedure(s) (LRB): CESAREAN SECTION (N/A)     Patient location during evaluation: Mother Baby Anesthesia Type: Epidural Level of consciousness: awake and alert and oriented Pain management: pain level controlled Vital Signs Assessment: post-procedure vital signs reviewed and stable Respiratory status: spontaneous breathing and nonlabored ventilation Cardiovascular status: stable Postop Assessment: no headache, no backache, patient able to bend at knees, no signs of nausea or vomiting and adequate PO intake Anesthetic complications: no    Last Vitals:  Vitals:   06/15/16 0129 06/15/16 0620  BP: (!) 116/53   Pulse: (!) 105   Resp: 17 18  Temp: 36.9 C 36.7 C    Last Pain:  Vitals:   06/15/16 0620  TempSrc: Oral  PainSc: 6    Pain Goal: Patients Stated Pain Goal: 2 (06/12/16 1104)               Madison HickmanGREGORY,[name]

## 2016-06-15 NOTE — Lactation Note (Signed)
This note was copied from a baby's chart. Lactation Consultation Note Mom's 3rd baby. She didn't try to BF her 1st baby, she attempted to BF her 2nd baby for 2 days then stopped she didn't like it. Mom wants to try to BF/and formula feed this baby. Mom has WIC. Mom has short shaft nipples, compressible areolas. Shells given to wear in am, hand pump given to pre-pump nipples to evert prior to latching. Hand expression taught w/colostrum noted. Hand expressed 5ml, spoon fed baby after attempting to BF, has no interest or wouldn't suck.  Baby's abd. Slightly distended, explaind to mom that is a lot of times why babies aren't hungry until spits it up or poops it out.  Mom encouraged to feed baby 8-12 times/24 hours and with feeding cues. Encouraged to wake baby every 2-3 hours to feed, write down I&O, and do STS as much as possible. Newborn behavior and feeding habits explained.  Encouraged to call for assistance or questions.  WH/LC brochure given w/resources, support groups and LC services. Patient Name: [name] Hull ZOXWR'UToday's Date: 06/15/2016 Reason for consult: Initial assessment   Maternal Data Has patient been taught Hand Expression?: Yes Does the patient have breastfeeding experience prior to this delivery?: No  Feeding Feeding Type: Breast Milk Length of feed: 0 min  LATCH Score/Interventions Latch: Too sleepy or reluctant, no latch achieved, no sucking elicited. Intervention(s): Skin to skin;Teach feeding cues;Waking techniques  Audible Swallowing: None Intervention(s): Hand expression  Type of Nipple: Everted at rest and after stimulation  Comfort (Breast/Nipple): Soft / non-tender     Hold (Positioning): Full assist, staff holds infant at breast Intervention(s): Breastfeeding basics reviewed;Support Pillows;Position options;Skin to skin  LATCH Score: 4  Lactation Tools Discussed/Used Tools: Shells;Pump Shell Type: Inverted Breast pump type: Manual WIC  Program: Yes Pump Review: Setup, frequency, and cleaning;Milk Storage Initiated by:: Peri JeffersonL. Carlotta Telfair RN IBCLC Date initiated:: 06/15/16   Consult Status Consult Status: Follow-up Date: 06/15/16 Follow-up type: In-patient    Satine Hausner, Diamond NickelLAURA G 06/15/2016, 2:15 AM

## 2016-06-16 LAB — TYPE AND SCREEN
ABO/RH(D): O POS
Antibody Screen: NEGATIVE
UNIT DIVISION: 0
UNIT DIVISION: 0
Unit division: 0
Unit division: 0

## 2016-06-16 LAB — BPAM RBC
BLOOD PRODUCT EXPIRATION DATE: 201806182359
BLOOD PRODUCT EXPIRATION DATE: 201807122359
Blood Product Expiration Date: 201806292359
Blood Product Expiration Date: 201807122359
ISSUE DATE / TIME: 201806101856
ISSUE DATE / TIME: 201806110305
UNIT TYPE AND RH: 9500
Unit Type and Rh: 5100
Unit Type and Rh: 5100
Unit Type and Rh: 5100

## 2016-06-16 MED ORDER — POLYETHYLENE GLYCOL 3350 17 G PO PACK
17.0000 g | PACK | Freq: Every day | ORAL | Status: DC
  Administered 2016-06-16: 17 g via ORAL
  Filled 2016-06-16 (×3): qty 1

## 2016-06-16 NOTE — Progress Notes (Signed)
Called to evaluate patient by nurse, elevated edinburgh screen. Pt reports history of depression. Says current symptoms are mild. Has been on meds in past but doesn't like how they make her feel. SW has referred for therapy. Pt wants to defer meds for now. Recommending 2 wk mood check, will check in w/ patient in AM

## 2016-06-16 NOTE — Progress Notes (Signed)
Patient ID: [name] Hull, female   DOB: 07/06/1980, [age] y.o.   MRN: [id number]  POSTPARTUM PROGRESS NOTE  Post Op/Partum Day #2 Subjective:  [name] Hull is a 36 y.o. G3P3003 5417w3d s/p PLTCS.  No acute events overnight.  Pt denies problems with ambulating, voiding or po intake.  She denies nausea or vomiting.  Pain is moderately controlled.  She has not had flatus. She has not had bowel movement.  Lochia Small.   Objective: Blood pressure (!) 123/54, pulse 90, temperature 98.6 F (37 C), temperature source Oral, resp. rate 19, height 5\' 4"  (1.626 m), weight 203 lb (92.1 kg), last menstrual period 09/26/2015, SpO2 100 %, unknown if currently breastfeeding.  Physical Exam:  General: alert, cooperative and no distress Lochia:normal flow Chest: CTAB Heart: RRR no m/r/g Abdomen: +BS, soft, appropriately tender Uterine Fundus: firm, below umbilicus; incision is clean, dry, intact DVT Evaluation: No calf swelling or tenderness Extremities: Trace edema   Recent Labs  06/15/16 0003 06/15/16 0531  HGB 11.1* 10.3*  HCT 31.8* 29.5*    Assessment/Plan:  ASSESSMENT: [name] Hull is a 36 y.o. G3P3003 5317w3d s/p PLTCS  Plan for discharge tomorrow, Breastfeeding, Lactation consult and Contraception BTL Miralax today Encouraged increased ambulation   LOS: 4 days   Jen MowElizabeth Kanishk Stroebel, DO OB Fellow [facility]  06/16/2016, 7:49 AM

## 2016-06-16 NOTE — Plan of Care (Signed)
Problem: Activity: Goal: Will verbalize the importance of balancing activity with adequate rest periods Outcome: Completed/Met Date Met: 06/16/16 Patient not ambulating well independently. Encouraged patient to take additional pain medication and increase ambulation in room and in hallway today. After pain medication given, patient began to ambulate in room independently more comfortably.    

## 2016-06-16 NOTE — Plan of Care (Signed)
Problem: Coping: Goal: Ability to cope will improve Patient scored 11 on the [place] depression scale. Social work consult ordered. Upon speaking with patient, patient stated she has a history of depression and was on medication with her previous children; however, she did not like the way it made her feel. Patient states that she doesn't think she wants to go on any medication. Encouraged patient to seriously think about speaking with the doctor about going on medication and explained that a Child psychotherapistsocial worker will speak with patient and give resources and options as well. Upon asking, patient states that she does have support at home which is a friend that she lives with. Notified Dr. Ashok PallWouk of RN's conversation with patient. No new orders at this time. Earl Galasborne, Linda HedgesStefanie DumasHudspeth

## 2016-06-16 NOTE — Clinical Social Work Maternal (Signed)
  CLINICAL SOCIAL WORK MATERNAL/CHILD NOTE  Patient Details  Name: [name] Hull MRN: [id number] Date of Birth: [birth year]  Date:  06/16/2016  Clinical Social Worker Initiating Note:  Laurey Arrow Date/ Time Initiated:  06/16/16/1502     Child's Name:  unknown   Legal Guardian:  Mother   Need for Interpreter:  Spanish, Arabic   Date of Referral:  06/16/16     Reason for Referral:   (Score of 11 on [place] Depression Screen and hx of anxiety/depression)   Referral Source:      Address:  [address]  [address] [address]  Phone number:  [phone]   Household Members:  Self, Minor Children, Spouse   Natural Supports (not living in the home):  Immediate Family   Professional Supports: None   Employment:     Type of Work:     Education:      Pensions consultant:  [employer]   Other Resources:      Cultural/Religious Considerations Which May Impact Care:  None Reported  Strengths:  Home prepared for child , Understanding of illness, Ability to meet basic needs    Risk Factors/Current Problems:  Mental Health Concerns    Cognitive State:  Alert , Able to Concentrate , Linear Thinking    Mood/Affect:  Calm , Tearful , Flat , Interested , Comfortable    CSW Assessment: CSW met with MOB and Spanish Speaking Interpretor.  When CSW arrived MOB was resting on the couch and infant was asleep in the bassinet.  CSW introduced herself and explained CSW's role.  MOB was polite and receptive to meeting with CSW.  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  MOB reported that MOB took medication in the past (name unknown), but discontinued it because MOB did not like how it made MOB feel.  MOB denied receiving outpatient behavioral health counseling and was receptive to the referral.  CSW provided MOB with information to [facility] [place] where MOB can receive services in Spanish.  MOB agreed to  contact agency today today to schedule an appointment. MOB did not present with any acute mental health symptoms, and presents with insight and self-awareness related to her mental health needs.  MOB denied SI, HI, and DV. CSW educated MOB about PPD. CSW informed MOB of possible supports and interventions to decrease PPD.  CSW also encouraged MOB to seek medical attention if needed for increased signs and symptoms for PPD.  CSW Plan/Description:  Patient/Family Education , No Further Intervention Required/No Barriers to Discharge   Laurey Arrow, MSW, LCSW Clinical Social Work [phone]   Dimple Nanas, LCSW 06/16/2016, 3:06 PM

## 2016-06-16 NOTE — Lactation Note (Signed)
This note was copied from a baby's chart. Lactation Consultation Note  Patient Name: [name] Hull JXBJY'NToday's Date: 06/16/2016 Reason for consult: Follow-up assessment - Bonnye FavaViria ( spanish translator present for consult )  Baby is 1346 hours old and has been to the breast and supplemented.  Baby due to eat, LC changed a wet and stool diaper ,  LC kept baby on double photo wrap and handed baby to mom baby fed for 14 mins with  Swallows , and released. LC showed mom how to PACE feed.  LC gave mom 2 options - she could feed the baby 20 mins . Supplement , post pump both breast  15 -20 mins, or feed both breast / supplement , post pump 15 -20 mins to enhance milk coming in.  LC prior to feeding set up DEBP with instructions and showed mom how to pump , checked #24 flange,  Good fit and per mom comfortable. Mom aware to post pump and the reason for it.  @ 1st just wanted to breast feed and supplement . And after LC explained to mom the importance of  Getting the milk to come in and breast milk would be digested quicker and work on cleaning the gut out,  Mom agreed to add post pumping.  MBU RN Claudina LickMary Fitch aware  Of the consult results and the [facility] plan.     Maternal Data    Feeding Feeding Type: Breast Fed (right breast / cross cradle ) Length of feed: 14 min (swallows noted )  LATCH Score/Interventions Latch: Grasps breast easily, tongue down, lips flanged, rhythmical sucking. Intervention(s): Skin to skin;Teach feeding cues;Waking techniques  Audible Swallowing: Spontaneous and intermittent  Type of Nipple: Everted at rest and after stimulation  Comfort (Breast/Nipple): Soft / non-tender     Hold (Positioning): No assistance needed to correctly position infant at breast. Intervention(s): Breastfeeding basics reviewed;Support Pillows;Position options;Skin to skin  LATCH Score: 10  Lactation Tools Discussed/Used Tools: Pump (#24 Flange a comfort fit , LC checked and had mom  pump ) Breast pump type: Double-Electric Breast Pump   Consult Status Consult Status: Follow-up Date: 06/17/16 Follow-up type: In-patient    [name] Hull 06/16/2016, 4:31 PM

## 2016-06-17 DIAGNOSIS — Z98891 History of uterine scar from previous surgery: Secondary | ICD-10-CM

## 2016-06-17 LAB — CBC
HEMATOCRIT: 28.5 % — AB (ref 36.0–46.0)
HEMOGLOBIN: 9.7 g/dL — AB (ref 12.0–15.0)
MCH: 32.6 pg (ref 26.0–34.0)
MCHC: 34 g/dL (ref 30.0–36.0)
MCV: 95.6 fL (ref 78.0–100.0)
Platelets: 296 10*3/uL (ref 150–400)
RBC: 2.98 MIL/uL — AB (ref 3.87–5.11)
RDW: 14.3 % (ref 11.5–15.5)
WBC: 9.8 10*3/uL (ref 4.0–10.5)

## 2016-06-17 LAB — GLUCOSE, CAPILLARY: Glucose-Capillary: 124 mg/dL — ABNORMAL HIGH (ref 65–99)

## 2016-06-17 MED ORDER — SERTRALINE HCL 50 MG PO TABS: 50.0000 mg | ORAL_TABLET | Freq: Every day | ORAL | 2 refills | Status: DC

## 2016-06-17 MED ORDER — DOCUSATE SODIUM 100 MG PO CAPS: 100.0000 mg | ORAL_CAPSULE | Freq: Two times a day (BID) | ORAL | 0 refills | Status: DC

## 2016-06-17 MED ORDER — OXYCODONE-ACETAMINOPHEN 5-325 MG PO TABS: 1.0000 | ORAL_TABLET | ORAL | 0 refills | Status: DC | PRN

## 2016-06-17 MED ORDER — SERTRALINE HCL 50 MG PO TABS: 50.0000 mg | ORAL_TABLET | Freq: Every day | ORAL | 11 refills | Status: DC

## 2016-06-17 MED ORDER — IBUPROFEN 600 MG PO TABS: 600.0000 mg | ORAL_TABLET | Freq: Four times a day (QID) | ORAL | 0 refills | Status: DC

## 2016-06-17 MED ORDER — OXYCODONE-ACETAMINOPHEN 5-325 MG PO TABS: 1.0000 | ORAL_TABLET | Freq: Four times a day (QID) | ORAL | 0 refills | Status: DC | PRN

## 2016-06-17 MED ORDER — SERTRALINE HCL 50 MG PO TABS
50.0000 mg | ORAL_TABLET | Freq: Every day | ORAL | Status: DC
  Administered 2016-06-17: 50 mg via ORAL
  Filled 2016-06-17 (×2): qty 1

## 2016-06-17 NOTE — Discharge Summary (Signed)
OB Discharge Summary     Patient Name: [name] DOB: 08/17/1980 MRN: [id number]  Date of admission: 06/12/2016 Delivering MD: Elsie LincolnLEGGETT, KELLY H   Date of discharge: 06/17/2016  Admitting diagnosis: 37WKS,CTX Intrauterine pregnancy: 7268w3d     Secondary diagnosis:  Principal Problem:   Status post repeat low transverse cesarean section Active Problems:   Umbilical vein varix   Intrapartum hemorrhage, delivered  Additional problems: Depression and anxiety     Discharge diagnosis: Term Pregnancy Delivered                                                                                                Post partum procedures:Tubal ligation  Augmentation: none  Complications: Hemorrhage>102700mL  Hospital course:  Sceduled C/S   36 y.o. yo G3P3003 at 2968w3d was admitted to the hospital 06/12/2016 for scheduled cesarean section with the following indication:Varix.  Membrane Rupture Time/Date: 6:03 AM ,06/14/2016   Patient delivered a Viable infant.06/14/2016  Details of operation can be found in separate operative note.  Pateint had an uncomplicated postpartum course.  She is ambulating, tolerating a regular diet, passing flatus, and urinating well. Patient is discharged home in stable condition on  06/17/16         Physical exam  Vitals:   06/15/16 1755 06/16/16 1814 06/17/16 0545 06/17/16 0945  BP: (!) 123/54 (!) 124/57 104/65 135/66  Pulse: 90 94 88 97  Resp: 19 18 18    Temp: 98.6 F (37 C) 98 F (36.7 C) 98.3 F (36.8 C)   TempSrc: Oral Oral Oral   SpO2: 100% 96%    Weight:      Height:       General: alert, cooperative and no distress Lochia: appropriate Uterine Fundus: firm Incision: N/A DVT Evaluation: No evidence of DVT seen on physical exam. Labs: Lab Results  Component Value Date   WBC 9.8 06/17/2016   HGB 9.7 (L) 06/17/2016   HCT 28.5 (L) 06/17/2016   MCV 95.6 06/17/2016   PLT 296 06/17/2016   CMP Latest Ref Rng & Units 04/09/2016  Glucose 65 - 99  mg/dL 93  BUN 6 - 20 mg/dL 8  Creatinine 9.560.44 - 2.131.00 mg/dL 0.860.46  Sodium 578135 - 469145 mmol/L 136  Potassium 3.5 - 5.1 mmol/L 3.5  Chloride 101 - 111 mmol/L 107  CO2 22 - 32 mmol/L 19(L)  Calcium 8.9 - 10.3 mg/dL 8.0(L)  Total Protein 6.5 - 8.1 g/dL 6.6  Total Bilirubin 0.3 - 1.2 mg/dL 0.6  Alkaline Phos 38 - 126 U/L 49  AST 15 - 41 U/L 20  ALT 14 - 54 U/L 16    Discharge instruction: per After Visit Summary and "Baby and Me Booklet".  -Patient to begin Zoloft 50 mg and return to see provider and [facility]Jamie ([facility]. Message sent to clinic to call for appt.    After visit meds:  Allergies as of 06/17/2016   No Known Allergies     Medication List    STOP taking these medications   aspirin 81 MG tablet     TAKE  these medications   docusate sodium 100 MG capsule Commonly known as:  COLACE Take 1 capsule (100 mg total) by mouth 2 (two) times daily. What changed:  when to take this   ibuprofen 600 MG tablet Commonly known as:  ADVIL,MOTRIN Take 1 tablet (600 mg total) by mouth every 6 (six) hours.   oxyCODONE-acetaminophen 5-325 MG tablet Commonly known as:  ROXICET Take 1-2 tablets by mouth every 4 (four) hours as needed for severe pain.   oxyCODONE-acetaminophen 5-325 MG tablet Commonly known as:  ROXICET Take 1-2 tablets by mouth every 6 (six) hours as needed.   sertraline 50 MG tablet Commonly known as:  ZOLOFT Take 1 tablet (50 mg total) by mouth daily.   sertraline 50 MG tablet Commonly known as:  ZOLOFT Take 1 tablet (50 mg total) by mouth daily.       Diet: routine diet  Activity: Advance as tolerated. Pelvic rest for 6 weeks.   Outpatient follow up:2 weeks Follow up Appt: Future Appointments Date Time Provider Department Center  07/27/2016 2:40 PM Donette Larry, CNM [facility]   Follow up Visit:No Follow-up on file.  Postpartum contraception: Tubal in hospital  Newborn Data: Live born female  Birth Weight: 9 lb 6.1 oz (4255  g) APGAR: 8, 8  Baby Feeding: Bottle and Breast Disposition:home with mother-   06/17/2016 Marylene Land, CNM

## 2016-06-17 NOTE — Discharge Instructions (Signed)
Parto por cesárea, cuidados posteriores °(Cesarean Delivery, Care After) °Siga estas instrucciones durante las próximas semanas. Estas indicaciones le proporcionan información acerca de cómo deberá cuidarse después del procedimiento. El médico también podrá darle instrucciones más específicas. El tratamiento ha sido planificado según las prácticas médicas actuales, pero en algunos casos pueden ocurrir problemas. Comuníquese con el médico si tiene algún problema o dudas después del procedimiento. °QUÉ ESPERAR DESPUÉS DEL PROCEDIMIENTO °Después del procedimiento, es común tener los siguientes síntomas: °· Una pequeña cantidad de sangre o un líquido transparente que sale de la incisión. °· Tiene enrojecimiento, hinchazón o dolor en la zona de la incisión. °· Dolores y molestias abdominales. °· Hemorragia vaginal (loquios). °· Calambres pélvicos. °· Fatiga. °INSTRUCCIONES PARA EL CUIDADO EN EL HOGAR °Cuidado de la incisión °· Siga las indicaciones del médico acerca del cuidado de la incisión. Haga lo siguiente: °? Lávese las manos con agua y jabón antes de cambiar las vendas (vendaje). Use desinfectante para manos si no dispone de agua y jabón. °? Cambie el vendaje como se lo haya indicado el médico. °? No retire los puntos (suturas), las grapas cutáneas, el pegamento para la piel o las tiras adhesivas. Es posible que estos deban quedar puestos en la piel durante 2 semanas o más tiempo. Si los bordes de las tiras adhesivas empiezan a despegarse y enroscarse, puede recortar los que están sueltos. No retire las tiras adhesivas por completo a menos que el médico se lo indique. °· Controle todos los días la zona de la incisión para detectar signos de infección. Esté atenta a los siguientes signos: °? Aumento del enrojecimiento, la hinchazón o el dolor. °? Más líquido o sangre. °? Calor. °? Pus o mal olor. °· Cuando tosa o estornude, abrace una almohada. Esto ayuda con el dolor y disminuye la posibilidad de que su incisión  se abra (dehiscencia). Haga esto hasta que cicatrice completamente. °Medicamentos °· Tome los medicamentos de venta libre y los recetados solamente como se lo haya indicado el médico. °· Si le recetaron un antibiótico, tómelo como se lo haya indicado el médico. No interrumpa la administración del antibiótico hasta que no lo hay terminado. °Conducir °· No conduzca ni opere maquinaria pesada mientras toma analgésicos recetados. °· No conduzca durante 24 horas si le administraron un sedante. °Estilo de vida °· No beba alcohol. Esto es de suma importancia si está amamantando o toma analgésicos. °· No consuma productos que contengan tabaco, incluidos cigarrillos, tabaco de mascar o cigarrillos electrónicos. Si necesita ayuda para dejar de fumar, consulte al médico. El tabaco puede retrasar la cicatrización. °Comida y bebida °· Beba al menos 8 vasos de 8 onzas (240 cc) de agua todos los días a menos que el médico le indique lo contrario. Si amamanta, quizá deba beber aún más cantidad de agua. °· Ingiera alimentos ricos en fibras todos los días. Estos alimentos pueden ayudarla a prevenir o aliviar el estreñimiento. Los alimentos ricos en fibras incluyen, entre otros: °? Panes y cereales integrales. °? Arroz integral. °? Frijoles. °? Frutas y verduras frescas. °Actividad °· Reanude sus actividades normales como se lo haya indicado el médico. Pregúntele al médico qué actividades son seguras para usted. °· Descanse todo lo que pueda. Trate de descansar o tomar una siesta mientras el bebé duerme. °· No levante objetos que pesen más que su bebé o más de 10 libras (4,5 kg), como se lo haya indicado el médico. °· Pregúntele al médico cuándo puede volver a tener relaciones sexuales. Esto puede depender de lo siguiente: °? Riesgo de sufrir   infecciones. °? Velocidad de cicatrización. °? Comodidad y deseo de tener relaciones sexuales. °El baño °· No tome baños de inmersión, no nade ni use el jacuzzi hasta que el médico lo autorice.  Pregúntele al médico si puede ducharse. Tal vez solo le permitan darse baños de esponja hasta que la incisión se cure. °· Mantenga el vendaje seco, como se lo haya indicado el médico. °Instrucciones generales °· No use tampones ni se haga duchas vaginales hasta que el médico la autorice. °· Use lo siguiente: °? Ropa cómoda y suelta. °? Un sostén firme y bien ajustado. °· Controle la sangre que elimina por la vagina para detectar coágulos. Pueden tener el aspecto de grumos de color rojo oscuro, marrón o negro. °· Mantenga el perineo limpio y seco, como se lo haya indicado el médico. °· Cuando vaya al baño, siempre higienícese de adelante hacia atrás. °· Si es posible, consiga que alguien la ayude a cuidar de su bebé y con las tareas domésticas durante algunos días después de recibir el alta hospitalaria °· Concurra a todas las visitas de seguimiento para usted y el bebé, como se lo haya indicado el médico. Esto es importante. °SOLICITE ATENCIÓN MÉDICA SI: °· Tiene los siguientes síntomas: °? Una secreción vaginal con mal olor. °? Dificultad para orinar. °? Dolor al orinar. °? Aumento o disminución repentinos de la frecuencia con que defeca. °? Aumento del enrojecimiento, de la hinchazón o del dolor alrededor de la incisión. °? Aumento del líquido o de la sangre que sale de la incisión. °? Pus o mal olor en el lugar de la incisión. °? Fiebre. °? Una erupción cutánea. °? Poco interés o falta de interés en actividades que solían gustarle. °? Dudas sobre su cuidado y el del bebé. °? Náuseas. °· La incisión está caliente al tacto. °· Sus senos se ponen rojos o duros, o causan dolor. °· Siente tristeza o preocupación de forma inusual. °· Vomita. °· Elimina coágulos grandes por la vagina. Si elimina un coágulo de sangre, guárdelo para mostrárselo al médico. No elimine los coágulos sanguíneos por el inodoro sin mostrárselos a su médico. °· Orina más de lo habitual. °· Se siente mareada o se desmaya. °· No amamantó al bebé y  no tuvo su período menstrual en un período de 12 semanas posterior al parto. °· Usted dejó de amamantar al bebé y no tuvo su período menstrual en un período de 12 semanas posterior a haber dejado de amamantar. ° °SOLICITE ATENCIÓN MÉDICA DE INMEDIATO SI: °· Tiene los siguientes síntomas: °? Dolor que no desaparece o no mejora con el medicamento. °? Dolor en el pecho. °? Dificultad para respirar. °? Visión borrosa o manchas en la vista. °? Pensamientos de autolesionarse o lesionar al bebé. °? Nuevo dolor en el abdomen o en una de las piernas. °? Dolor de cabeza intenso. °· Se desmaya. °· Tiene una hemorragia tan intensa de la vagina que empapa dos toallitas sanitarias en una hora. ° °Esta información no tiene como fin reemplazar el consejo del médico. Asegúrese de hacerle al médico cualquier pregunta que tenga. °Document Released: 12/22/2004 Document Revised: 04/15/2015 Document Reviewed: 11/26/2014 °Elsevier Interactive Patient Education © 2017 Elsevier Inc. ° °

## 2016-06-17 NOTE — Progress Notes (Signed)
Patient feeling light heading and seeing spots. Midwife notified face to face.  Glucose checked- 124. VS stable.  Had toast and juice for breakfast, encouraged her to eat something more like eggs.  Ordered omelet and took baby to nursey so mom could rest. Will continue to monitor.

## 2016-06-17 NOTE — Progress Notes (Deleted)
POSTOPERATIVE DAY # 3 S/P PLTCS 2/2 CPD   S:         Reports feeling better; dizzy and occasional spots             Tolerating po intake / no nausea / no vomiting / positve  flatus / no BM             Bleeding is light             Pain controlled withibuprofen (OTC) and narcotic analgesics including percocet             Up ad lib / ambulatory/ voiding QS  Newborn bottle feeding  / Circumcision NA  Successfully breastfeeding? yes    O:  VS: BP 135/66 (BP Location: Right Arm)   Pulse 97   Temp 98.3 F (36.8 C) (Oral)   Resp 18   Ht 5\' 4"  (1.626 m)   Wt 203 lb (92.1 kg)   LMP 09/26/2015   SpO2 96%   Breastfeeding? Unknown   BMI 34.84 kg/m    LABS:                Recent Labs  06/15/16 0531 06/17/16 1225  WBC 15.5* 9.8  HGB 10.3* 9.7*  PLT 191 296               Bloodtype: --/--/O POS (06/08 1155)  Rubella: Immune (12/07 0000)                     EBL: 200                         I&O: Intake/Output      06/12 0701 - 06/13 0700 06/13 0701 - 06/14 0700   P.O.     Total Intake(mL/kg)     Urine (mL/kg/hr)     Total Output       Net                         Physical Exam:             Alert and Oriented X3  Lungs: Clear and unlabored  Heart: regular rate and rhythm / no mumurs  Abdomen: soft, non-tender, non-distended              Fundus: firm, non-tender, U-1             Dressing Clean dry and intact             Incision:  UTA   Lochia: mild  Extremities: no edema, no calf pain or tenderness, negative Homans   Complications with CS: 2000 cc PPH    A:        POD # 3 S/P PLTCS 2/2 CPD            Patient complained  Of feeling light-headed and dizzy this morning. Now feeling better after rest, a high protein meal and  ambulation. Patient's CBC is now 9.7.   P:        Routine postoperative care              -[facility] to call for follow up in 2 weeks with [facility] for mood check;    patient to also see Jamie at two week visit.    Special  instructions/meds   -Begin taking Zoloft 50 mg daily; started with one dose in the hospital   -  Patient to follow up with [facility] for counseling and to see Asher Muir at [facility] in two weeks     Anticipate discharge on 06/17/2016    Charlesetta Garibaldi Sahir Tolson CNM 06/17/2016, 2:16 PM

## 2016-06-17 NOTE — Progress Notes (Signed)
Interpreter called to bedside. Pt stated she has felt dizzy on and off during her stay. She is feeling a little bit better with food and rest.  Will continue to monitor.

## 2016-06-22 ENCOUNTER — Encounter: Payer: Self-pay | Admitting: Obstetrics & Gynecology

## 2016-06-29 ENCOUNTER — Encounter: Payer: Self-pay | Admitting: Obstetrics and Gynecology

## 2016-07-15 ENCOUNTER — Ambulatory Visit (INDEPENDENT_AMBULATORY_CARE_PROVIDER_SITE_OTHER): Payer: Self-pay | Admitting: Obstetrics & Gynecology

## 2016-07-15 ENCOUNTER — Encounter: Payer: Self-pay | Admitting: Obstetrics & Gynecology

## 2016-07-15 ENCOUNTER — Ambulatory Visit (INDEPENDENT_AMBULATORY_CARE_PROVIDER_SITE_OTHER): Payer: Self-pay | Admitting: Clinical

## 2016-07-15 DIAGNOSIS — F418 Other specified anxiety disorders: Secondary | ICD-10-CM

## 2016-07-15 DIAGNOSIS — F4323 Adjustment disorder with mixed anxiety and depressed mood: Secondary | ICD-10-CM

## 2016-07-15 DIAGNOSIS — O99345 Other mental disorders complicating the puerperium: Secondary | ICD-10-CM

## 2016-07-15 NOTE — BH Specialist Note (Signed)
Integrated Behavioral Health Initial Visit  MRN: [id number] Name: [name] Hull   Session Start time: 2:25 Session End time: 2:55 Total time: 30 minutes  Type of Service: Integrated Behavioral Health- Individual/Family Interpretor:Yes.   Interpretor Name and Language:, Spanish   Warm Hand Off Completed.       SUBJECTIVE: [name] Hull is a 36 y.o. female accompanied by patient and 3 children, ages 56,4,infant. Patient was referred by Dr. Penne LashLeggett for depression and anxiety Patient reports the following symptoms/concerns: Pt states that her primary concern is having thoughts that she would be better off dead, started within two weeks postpartum; [place], no intent, no plan. Pt began Zoloft one month ago, no negative side effects. She feels "a little" better when she goes outside with her children.  Duration of problem: Over one month; Severity of problem: severe  OBJECTIVE: Mood: Depressed and Affect: Depressed Risk of harm to self or others: Suicidal ideation No plan to harm self or others   LIFE CONTEXT: Family and Social: Lives with three children and a female cousin School/Work: - Self-Care: - Life Changes: Recent childbirth, no other known changes  GOALS ADDRESSED: Patient will reduce symptoms of: depression and increase knowledge and/or ability of: self-management skills and also: Increase healthy adjustment to current life circumstances   INTERVENTIONS: Solution-Focused Strategies, Psychoeducation and/or Health Education and Link to [facility]  Standardized Assessments completed: GAD-7 and PHQ 2&9 with C-SSRS  ASSESSMENT: Patient currently experiencing Adjustment disorder with mixed anxious and depressed mood. Patient may benefit from psychoeducation and brief therapeutic intervention regarding coping with symptoms of anxiety and depression.  PLAN: 1. Follow up with behavioral health  clinician on : Two days via phone, two weeks office visit 2. Behavioral recommendations:  -Take BH meds as prescribed by medical provider(increase today) -Follow Safety Plan -Daily walk with children outside daily (at least 5 minute walk, at least 20 minutes outside) -Use distraction strategies if thoughts return(talk to cousin and other friends/family about feelings) -Walk-in clinic [facility](with own interpreter) OR Call [facility] to set up appointment with Spanish-speaking counselor within one month, for continued therapy and BH med management 3. Referral(s): Integrated Art gallery managerBehavioral Health Services (In Clinic) and [facility] ([facility]/Outside Clinic)   Rae LipsJamie C Alsha Meland, [place]   Depression screen [facility] 2/9 07/15/2016 06/08/2016 05/19/2016 04/30/2016 04/17/2016  Decreased Interest 1 1 1 1 1   Down, Depressed, Hopeless 1 1 1 1 1   PHQ - 2 Score 2 2 2 2 2   Altered sleeping 0 1 1 1 1   Tired, decreased energy 1 1 1 1 1   Change in appetite 1 1 1 1 1   Feeling bad or failure about yourself  1 1 1  0 0  Trouble concentrating 1 1 1 1  0  Moving slowly or fidgety/restless 1 1 1 1 1   Suicidal thoughts 1 0 0 0 0  PHQ-9 Score 8 8 8 7 6    GAD 7 : Generalized Anxiety Score 07/15/2016 06/08/2016 05/19/2016 04/30/2016  Nervous, Anxious, on Edge 1 1 1 1   Control/stop worrying 1 1 1 1   Worry too much - different things 1 1 1 1   Trouble relaxing 1 1 1 1   Restless 1 1 1 1   Easily annoyed or irritable 1 1 1 1   Afraid - awful might happen 1 1 1 1   Total GAD 7 Score 7 7 7  7

## 2016-07-15 NOTE — Progress Notes (Signed)
Subjective:     [name] is a 36 y.o. female who presents for a postpartum visit. She is 4 weeks postpartum following a low cervical transverse Cesarean section. I have fully reviewed the prenatal and intrapartum course. The delivery was at 37.3 gestational weeks. Outcome: primary cesarean section, low transverse incision. Anesthesia: epidural. Postpartum course has been unremarkable. Patient admits to sometimes struggling with her "mental health." Baby's course has been unremarkable. Baby is feeding by both breast and bottle - Similac Advance. Bleeding no bleeding. Bowel function is normal. Bladder function is normal. Patient is not sexually active. Contraception method is BTL. Depression/anxiety screening: negative but patient is on Zoloft.  Patient verbally consented to [facility] about presenting concerns. Dr Macon LargeAnyanwu aware  The following portions of the patient's history were reviewed and updated as appropriate: allergies, current medications, past family history, past medical history, past social history, past surgical history and problem list. Pap smear was normal 12/12/2015.  Review of Systems Pertinent items noted in HPI and remainder of comprehensive ROS otherwise negative.   Objective:    BP (!) 141/73   Pulse 88   Ht 5\' 3"  (1.6 m)   Wt 177 lb (80.3 kg)   Breastfeeding? Yes   BMI 31.35 kg/m   General:  alert and no distress   Breasts:  inspection negative, no nipple discharge or bleeding, no masses or nodularity palpable  Lungs: clear to auscultation bilaterally  Heart:  regular rate and rhythm  Abdomen: soft, non-tender; bowel sounds normal; no masses,  no organomegaly. Incision C/D/I  Pelvic:  not evaluated        Assessment:   Normal postpartum exam. Pap smear not done at today's visit.   Plan:    1. Contraception: tubal ligation 2. Continue Zoloft; will see Asher MuirJamie today.  3. Follow up as needed.    Jaynie CollinsUGONNA  Brayln Duque, MD,  FACOG Attending Obstetrician & Gynecologist, [facility] for [facility], [facility]

## 2016-07-27 ENCOUNTER — Ambulatory Visit: Payer: Self-pay | Admitting: Certified Nurse Midwife

## 2016-08-05 ENCOUNTER — Encounter: Payer: Medicaid Other | Admitting: Obstetrics and Gynecology

## 2016-08-05 NOTE — Progress Notes (Signed)
Patient thought she was here today for a postpartum visit which she has already had on 07/15/2016. Patient was suppose to follow up with behavior health [facility]

## 2016-08-06 ENCOUNTER — Encounter: Payer: Self-pay | Admitting: Pediatric Intensive Care

## 2016-08-06 DIAGNOSIS — Z0000 Encounter for general adult medical examination without abnormal findings: Secondary | ICD-10-CM

## 2016-08-06 LAB — GLUCOSE, POCT (MANUAL RESULT ENTRY): POC GLUCOSE: 83 mg/dL (ref 70–99)

## 2016-09-09 NOTE — Congregational Nurse Program (Signed)
Congregational Nurse Program Note  Date of Encounter: 08/06/2016  Past Medical History: Past Medical History:  Diagnosis Date  . Anxiety   . Depression   . Foot fracture, left    as child  . Gestational diabetes   . History of eclampsia 02/17/2016   Base line PIH labs ordered today 02/17/2016: neg Taking ASA  . Hypertension   . Pregnancy induced hypertension   . Seizure disorder during pregnancy [facility](HCC) 2012    Encounter Details: Via interpreter Kathie RhodesBetty- new client. Requests BP/BG check. No personal history of hypertension but states she had gestational diabetes with 3 pregnacies. Client does not have a PCP and would like primary care. She applied for an [employer]. CN will assist with PCP appointment.

## 2017-03-02 ENCOUNTER — Other Ambulatory Visit: Payer: Self-pay

## 2017-03-02 ENCOUNTER — Emergency Department (HOSPITAL_COMMUNITY)
Admission: EM | Admit: 2017-03-02 | Discharge: 2017-03-02 | Disposition: A | Discharge status: Home / Self Care | Payer: Self-pay | Attending: Physician Assistant | Admitting: Physician Assistant

## 2017-03-02 ENCOUNTER — Encounter (HOSPITAL_COMMUNITY): Payer: Self-pay | Admitting: Emergency Medicine

## 2017-03-02 DIAGNOSIS — J111 Influenza due to unidentified influenza virus with other respiratory manifestations: Secondary | ICD-10-CM | POA: Insufficient documentation

## 2017-03-02 DIAGNOSIS — I10 Essential (primary) hypertension: Secondary | ICD-10-CM | POA: Insufficient documentation

## 2017-03-02 DIAGNOSIS — Z79899 Other long term (current) drug therapy: Secondary | ICD-10-CM | POA: Insufficient documentation

## 2017-03-02 LAB — RAPID STREP SCREEN (MED CTR MEBANE ONLY): Streptococcus, Group A Screen (Direct): NEGATIVE

## 2017-03-02 MED ORDER — ONDANSETRON 4 MG PO TBDP: ORAL_TABLET | ORAL | 0 refills | Status: DC

## 2017-03-02 MED ORDER — ACETAMINOPHEN 500 MG PO TABS
1000.0000 mg | ORAL_TABLET | Freq: Once | ORAL | Status: AC
  Administered 2017-03-02: 1000 mg via ORAL
  Filled 2017-03-02: qty 2

## 2017-03-02 NOTE — ED Triage Notes (Signed)
Pt reports Sunday she felt headache, body aches, and fevers. Takes ibuprofen and helps but only for few hours.

## 2017-03-02 NOTE — ED Provider Notes (Addendum)
M[facility] EMERGENCY DEPARTMENT Provider Note   CSN: [id number] Arrival date & time: 03/02/17  4098     History   Chief Complaint Chief Complaint  Patient presents with  . Generalized Body Aches    HPI   [name] Hull is a 37 y.o. Female with a history of hypertension, depression and anxiety, who presents to the ED for evaluation of 3 days of body aches, fevers, chills, and headache.  Patient reports since onset symptoms have been constant and not improving.  Patient reports some associated nausea, but no vomiting or abdominal pain, no diarrhea.  Patient reports a little bit of scratchy throat, denies cough, rhinorrhea or nasal congestion, no ear pain.  No chest pain or shortness of breath.  Patient tried some ibuprofen for her symptoms which helps for a few hours but then symptoms return.  No other medications prior to arrival.  Unsure if she has been exposed to the flu, did not have her flu vaccine this year.       Past Medical History:  Diagnosis Date  . Anxiety   . Depression   . Foot fracture, left    as child  . Gestational diabetes   . History of eclampsia 02/17/2016   Base line PIH labs ordered today 02/17/2016: neg Taking ASA  . Hypertension   . Pregnancy induced hypertension   . Seizure disorder during pregnancy [facility]) 2012    Patient Active Problem List   Diagnosis Date Noted  . Language barrier 04/30/2016    Past Surgical History:  Procedure Laterality Date  . CESAREAN SECTION N/A 06/14/2016   Procedure: CESAREAN SECTION;  Surgeon: Lesly Dukes, MD;  Location: [facility] BIRTHING SUITES;  Service: Obstetrics;  Laterality: N/A;  . NO PAST SURGERIES      OB History    Gravida Para Term Preterm AB Living   3 3 3     3    SAB TAB Ectopic Multiple Live Births         0 3       Home Medications    Prior to Admission medications   Medication Sig Start Date End Date Taking? Authorizing Provider  acetaminophen (TYLENOL) 325 MG  tablet Take 650 mg by mouth every 6 (six) hours as needed.    [provider]  sertraline (ZOLOFT) 50 MG tablet Take 1 tablet (50 mg total) by mouth daily. 06/17/16 06/17/17  Marylene Land, CNM    Family History Family History  Problem Relation Age of Onset  . Hypertension Mother   . Diabetes Mother     Social History Social History   Tobacco Use  . Smoking status: Never Smoker  . Smokeless tobacco: Never Used  Substance Use Topics  . Alcohol use: No  . Drug use: No     Allergies   Patient has no known allergies.   Review of Systems Review of Systems  Constitutional: Positive for chills, fatigue and fever.  HENT: Negative for congestion, ear discharge, ear pain, postnasal drip, rhinorrhea, sinus pressure, sinus pain, sneezing, sore throat and trouble swallowing.   Eyes: Negative for discharge, redness and itching.  Respiratory: Negative for cough, chest tightness, shortness of breath and wheezing.   Cardiovascular: Negative for chest pain.  Gastrointestinal: Positive for nausea. Negative for abdominal pain, blood in stool, diarrhea and vomiting.  Genitourinary: Negative for dysuria, frequency and hematuria.  Musculoskeletal: Positive for back pain and myalgias. Negative for neck pain and neck stiffness.  Neurological: Positive for  headaches. Negative for dizziness, speech difficulty, weakness, light-headedness and numbness.     Physical Exam Updated Vital Signs BP 123/67 (BP Location: Right Arm)   Pulse 99   Temp 99.3 F (37.4 C) (Oral)   Resp 18   LMP 02/01/2017   SpO2 99%   Physical Exam  Constitutional: She is oriented to person, place, and time. She appears well-developed and well-nourished. No distress.  HENT:  Head: Normocephalic and atraumatic.  TMs clear with good landmarks, mmild nasal mucosa edema with clear rhinorrhea, posterior oropharynx clear and moist, with some erythema and edema, but no exudates, uvula midline  Eyes: Right  eye exhibits no discharge. Left eye exhibits no discharge.  Neck: Normal range of motion. Neck supple.  Neck with normal range of motion in all directions, able to touch chin to chest, no rigidity, negative Brudzinski's and Kernig's sign  Cardiovascular: Normal rate, regular rhythm and normal heart sounds.  Pulmonary/Chest: Effort normal and breath sounds normal. No stridor. No respiratory distress. She has no wheezes. She has no rales.  Respirations equal and unlabored, patient able to speak in full sentences, lungs clear to auscultation bilaterally  Abdominal: Soft. Bowel sounds are normal. She exhibits no distension and no mass. There is no tenderness. There is no guarding.  Abdomen soft nontender to palpation in all quadrants no peritoneal signs  Musculoskeletal: She exhibits no edema or deformity.  Neurological: She is alert and oriented to person, place, and time. Coordination normal.  Speech is clear, able to follow commands CN III-XII grossly intact Normal strength in upper and lower extremities bilaterally including dorsiflexion and plantar flexion, strong and equal grip strength Sensation normal to light and sharp touch Moves extremities without ataxia, coordination intact  Skin: Skin is warm and dry. Capillary refill takes less than 2 seconds. She is not diaphoretic.  Psychiatric: She has a normal mood and affect. Her behavior is normal.  Nursing note and vitals reviewed.    ED Treatments / Results  Labs (all labs ordered are listed, but only abnormal results are displayed) Labs Reviewed  RAPID STREP SCREEN (NOT AT [facility])  CULTURE, GROUP A STREP [facility])    EKG  EKG Interpretation None       Radiology No results found.  Procedures Procedures (including critical care time)  Medications Ordered in ED Medications  acetaminophen (TYLENOL) tablet 1,000 mg (1,000 mg Oral Given 03/02/17 0947)     Initial Impression / Assessment and Plan / ED Course  I have reviewed  the triage vital signs and the nursing notes.  Pertinent labs & imaging results that were available during my care of the patient were reviewed by me and considered in my medical decision making (see chart for details).  Patient with symptoms consistent with influenza.  Vitals are stable, low-grade fever.  No signs of dehydration, tolerating PO's.  Lungs are clear. Due to patient's presentation and physical exam a chest x-ray was not ordered bc likely diagnosis of flu.  Rapid strep test negative. Discussed the cost versus benefit of Tamiflu treatment with the patient.  The patient understands that symptoms are greater than the recommended 24-48 hour window of treatment.  Patient will be discharged with instructions to orally hydrate, rest, and use over-the-counter medications such as anti-inflammatories ibuprofen and Aleve for muscle aches and Tylenol for fever.  Patient will also be given zofran for nausea  Patient discussed with Dr. Corlis LeakMackuen, who is in agreement with plan.   Final Clinical Impressions(s) / ED Diagnoses  Final diagnoses:  Influenza    ED Discharge Orders        Ordered    ondansetron (ZOFRAN ODT) 4 MG disintegrating tablet     03/02/17 1023       Dartha Lodge, [place] 03/02/17 1139    Dartha Lodge, [place] 03/02/17 1140    Abelino Derrick, MD 03/05/17 2004

## 2017-03-02 NOTE — Discharge Instructions (Signed)
Symptoms are likely caused by the flu, this is a virus and antibiotics are not helpful in treating it.  Tamiflu will not help with your symptoms when they have been present for greater than 2 days so was not provided today.  Please treat your symptoms supportively by drinking lots of fluids, taking ibuprofen and Tylenol regularly to help with fevers and pain, you may use Zofran as needed for nausea, over-the-counter cough syrups and lozenges for any cough that develops, if you develop nasal congestion please use Zyrtec and Flonase.  Symptoms should start to improve after 5-7 days if this is not the case please follow-up with primary care.  If you develop persistent fevers, productive cough, chest pain, shortness of breath, or persistent nausea and vomiting with dehydration please return to the ED for reevaluation.

## 2017-03-04 LAB — CULTURE, GROUP A STREP (THRC)

## 2018-03-23 ENCOUNTER — Emergency Department (HOSPITAL_COMMUNITY): Payer: Self-pay

## 2018-03-23 ENCOUNTER — Emergency Department (HOSPITAL_COMMUNITY)
Admission: EM | Admit: 2018-03-23 | Discharge: 2018-03-23 | Disposition: A | Discharge status: Home / Self Care | Payer: Self-pay | Attending: Emergency Medicine | Admitting: Emergency Medicine

## 2018-03-23 ENCOUNTER — Other Ambulatory Visit: Payer: Self-pay

## 2018-03-23 DIAGNOSIS — R1084 Generalized abdominal pain: Secondary | ICD-10-CM

## 2018-03-23 DIAGNOSIS — R112 Nausea with vomiting, unspecified: Secondary | ICD-10-CM | POA: Insufficient documentation

## 2018-03-23 DIAGNOSIS — R1011 Right upper quadrant pain: Secondary | ICD-10-CM | POA: Insufficient documentation

## 2018-03-23 LAB — URINALYSIS, ROUTINE W REFLEX MICROSCOPIC
Bacteria, UA: NONE SEEN
Bilirubin Urine: NEGATIVE
Glucose, UA: NEGATIVE mg/dL
Ketones, ur: NEGATIVE mg/dL
Leukocytes,Ua: NEGATIVE
Nitrite: NEGATIVE
PH: 7 (ref 5.0–8.0)
Protein, ur: NEGATIVE mg/dL
Specific Gravity, Urine: 1.029 (ref 1.005–1.030)

## 2018-03-23 LAB — I-STAT BETA HCG BLOOD, ED (MC, WL, AP ONLY)

## 2018-03-23 LAB — CBC WITH DIFFERENTIAL/PLATELET
Abs Immature Granulocytes: 0.06 10*3/uL (ref 0.00–0.07)
BASOS ABS: 0.1 10*3/uL (ref 0.0–0.1)
Basophils Relative: 1 %
EOS PCT: 2 %
Eosinophils Absolute: 0.2 10*3/uL (ref 0.0–0.5)
HCT: 40 % (ref 36.0–46.0)
Hemoglobin: 14.3 g/dL (ref 12.0–15.0)
Immature Granulocytes: 1 %
Lymphocytes Relative: 34 %
Lymphs Abs: 3.9 10*3/uL (ref 0.7–4.0)
MCH: 32.6 pg (ref 26.0–34.0)
MCHC: 35.8 g/dL (ref 30.0–36.0)
MCV: 91.3 fL (ref 80.0–100.0)
Monocytes Absolute: 0.8 10*3/uL (ref 0.1–1.0)
Monocytes Relative: 7 %
NRBC: 0 % (ref 0.0–0.2)
Neutro Abs: 6.7 10*3/uL (ref 1.7–7.7)
Neutrophils Relative %: 55 %
Platelets: 334 10*3/uL (ref 150–400)
RBC: 4.38 MIL/uL (ref 3.87–5.11)
RDW: 12.6 % (ref 11.5–15.5)
WBC: 11.7 10*3/uL — AB (ref 4.0–10.5)

## 2018-03-23 LAB — COMPREHENSIVE METABOLIC PANEL
ALK PHOS: 40 U/L (ref 38–126)
ALT: 30 U/L (ref 0–44)
ANION GAP: 9 (ref 5–15)
AST: 25 U/L (ref 15–41)
Albumin: 3.9 g/dL (ref 3.5–5.0)
BUN: 12 mg/dL (ref 6–20)
CALCIUM: 9.4 mg/dL (ref 8.9–10.3)
CO2: 22 mmol/L (ref 22–32)
Chloride: 103 mmol/L (ref 98–111)
Creatinine, Ser: 0.62 mg/dL (ref 0.44–1.00)
GFR calc Af Amer: >60 mL/min (ref >60)
GFR calc non Af Amer: >60 mL/min (ref >60)
Glucose, Bld: 133 mg/dL — ABNORMAL HIGH (ref 70–99)
Potassium: 4.1 mmol/L (ref 3.5–5.1)
SODIUM: 134 mmol/L — AB (ref 135–145)
TOTAL PROTEIN: 7.1 g/dL (ref 6.5–8.1)
Total Bilirubin: 0.5 mg/dL (ref 0.3–1.2)

## 2018-03-23 LAB — LIPASE, BLOOD: Lipase: 24 U/L (ref 11–51)

## 2018-03-23 MED ORDER — ONDANSETRON 4 MG PO TBDP: 4.0000 mg | ORAL_TABLET | Freq: Four times a day (QID) | ORAL | 0 refills | Status: DC | PRN

## 2018-03-23 MED ORDER — PANTOPRAZOLE SODIUM 40 MG PO TBEC
40.0000 mg | DELAYED_RELEASE_TABLET | Freq: Once | ORAL | Status: AC
  Administered 2018-03-23: 40 mg via ORAL
  Filled 2018-03-23: qty 1

## 2018-03-23 MED ORDER — ALUM & MAG HYDROXIDE-SIMETH 200-200-20 MG/5ML PO SUSP
30.0000 mL | Freq: Once | ORAL | Status: AC
  Administered 2018-03-23: 30 mL via ORAL
  Filled 2018-03-23: qty 30

## 2018-03-23 MED ORDER — LIDOCAINE VISCOUS HCL 2 % MT SOLN
15.0000 mL | Freq: Once | OROMUCOSAL | Status: AC
  Administered 2018-03-23: 15 mL via ORAL
  Filled 2018-03-23: qty 15

## 2018-03-23 MED ORDER — ONDANSETRON HCL 4 MG/2ML IJ SOLN
4.0000 mg | Freq: Once | INTRAMUSCULAR | Status: AC
  Administered 2018-03-23: 4 mg via INTRAVENOUS
  Filled 2018-03-23: qty 2

## 2018-03-23 MED ORDER — MORPHINE SULFATE (PF) 4 MG/ML IV SOLN
4.0000 mg | Freq: Once | INTRAVENOUS | Status: AC
  Administered 2018-03-23: 4 mg via INTRAVENOUS
  Filled 2018-03-23: qty 1

## 2018-03-23 MED ORDER — OMEPRAZOLE 40 MG PO CPDR: 40.0000 mg | DELAYED_RELEASE_CAPSULE | Freq: Every day | ORAL | 1 refills | Status: DC

## 2018-03-23 MED ORDER — SODIUM CHLORIDE 0.9 % IV BOLUS (SEPSIS)
1000.0000 mL | Freq: Once | INTRAVENOUS | Status: AC
  Administered 2018-03-23: 1000 mL via INTRAVENOUS

## 2018-03-23 MED ORDER — IOHEXOL 300 MG/ML  SOLN
100.0000 mL | Freq: Once | INTRAMUSCULAR | Status: AC | PRN
  Administered 2018-03-23: 100 mL via INTRAVENOUS

## 2018-03-23 NOTE — Discharge Instructions (Addendum)
You may use over-the-counter Maalox, Pepcid, Tums as needed for heartburn.  Your labs, urine, ultrasound, CT scan were normal today.  Steps to find a Primary Care Provider (PCP):  Call [phone] or [phone] to access "[place] Find a Doctor Service."  2.  You may also go on the [facility] website at [email or web address]  3.  [place] [facility] also frequently accepts new patients.  [facility]  [address] [place] [address] [phone]  4.  There are also multiple [facility], [facility] and [facility] practices throughout the [place] that are frequently accepting new patients. You may find a clinic that is close to your home and contact them.  [facility] [email or web address] [phone]  [email or web address]  [facility] [email or web address] [phone]  [facility] [email or web address] [phone]  5.  Local Health Departments also can provide primary care services.  [facility]  [address] [place] [address] [phone]  [facility] Department [address] [place] [address] [phone]  [facility] Department [address] [place] [address]  [place] [address] [phone]

## 2018-03-23 NOTE — ED Provider Notes (Signed)
TIME SEEN: 12:32 AM  CHIEF COMPLAINT: Abdominal pain, nausea and vomiting  HPI: Patient is a 38 year old female with no significant past medical history who presents to the emergency department with upper abdominal pain, nausea and vomiting that started today.  No aggravating or alleviating factors.  States she has felt warm but no known fever.  No chills.  No diarrhea, bloody stool, melena, dysuria, hematuria, vaginal bleeding or discharge.  States she has had a C-section in 2018.  No known history of gallstones.  Spanish interpreter used.  States she ate a grilled chicken salad for dinner tonight.  ROS: See HPI Constitutional: subjective fever  Eyes: no drainage  ENT: no runny nose   Cardiovascular:  no chest pain  Resp: no SOB  GI:  vomiting GU: no dysuria Integumentary: no rash  Allergy: no hives  Musculoskeletal: no leg swelling  Neurological: no slurred speech ROS otherwise negative  PAST MEDICAL HISTORY/PAST SURGICAL HISTORY:  Past Medical History:  Diagnosis Date  . Anxiety   . Depression   . Foot fracture, left    as child  . Gestational diabetes   . History of eclampsia 02/17/2016   Base line PIH labs ordered today 02/17/2016: neg Taking ASA  . Hypertension   . Pregnancy induced hypertension   . Seizure disorder during pregnancy [facility]) 2012    MEDICATIONS:  Prior to Admission medications   Medication Sig Start Date End Date Taking? Authorizing Provider  acetaminophen (TYLENOL) 325 MG tablet Take 650 mg by mouth every 6 (six) hours as needed.    [provider]  ondansetron (ZOFRAN ODT) 4 MG disintegrating tablet 4mg  ODT q4 hours prn nausea/vomit 03/02/17   Dartha Lodge, PA-C  sertraline (ZOLOFT) 50 MG tablet Take 1 tablet (50 mg total) by mouth daily. 06/17/16 06/17/17  Marylene Land, CNM    ALLERGIES:  No Known Allergies  SOCIAL HISTORY:  Social History   Tobacco Use  . Smoking status: Never Smoker  . Smokeless tobacco: Never Used   Substance Use Topics  . Alcohol use: No    FAMILY HISTORY: Family History  Problem Relation Age of Onset  . Hypertension Mother   . Diabetes Mother     EXAM: BP (!) 144/72 (BP Location: Right Arm)   Pulse 91   Temp 98.3 F (36.8 C) (Oral)   Resp (!) 22   SpO2 99%  CONSTITUTIONAL: Alert and oriented and responds appropriately to questions. Well-appearing; well-nourished HEAD: Normocephalic EYES: Conjunctivae clear, pupils appear equal, EOMI ENT: normal nose; moist mucous membranes NECK: Supple, no meningismus, no nuchal rigidity, no LAD  CARD: RRR; S1 and S2 appreciated; no murmurs, no clicks, no rubs, no gallops RESP: Normal chest excursion without splinting or tachypnea; breath sounds clear and equal bilaterally; no wheezes, no rhonchi, no rales, no hypoxia or respiratory distress, speaking full sentences ABD/GI: Normal bowel sounds; non-distended; soft, tender to palpation all over the abdomen but worse in the epigastric region and right upper quadrant with negative Murphy sign, no significant tenderness at McBurney's point, no guarding or rebound BACK:  The back appears normal and is non-tender to palpation, there is no CVA tenderness EXT: Normal ROM in all joints; non-tender to palpation; no edema; normal capillary refill; no cyanosis, no calf tenderness or swelling    SKIN: Normal color for age and race; warm; no rash NEURO: Moves all extremities equally PSYCH: The patient's mood and manner are appropriate. Grooming and personal hygiene are appropriate.  MEDICAL DECISION MAKING: Patient here  with upper abdominal pain.  Differential includes cholelithiasis, cholecystitis, pancreatitis, gastritis.  Low suspicion for appendicitis.  Will obtain labs, urine, right upper quadrant ultrasound.  States someone drove her here to the emergency department.  Will give pain and nausea medicine.  ED PROGRESS: Labs show mild leukocytosis of 11.7.  Ultrasound shows a normal gallbladder.   Given she has tenderness throughout her abdomen and history is somewhat limited given language barrier, will obtain CT of the abdomen pelvis for further evaluation.  Patient's labs are unremarkable other than the mild leukocytosis.  CT scan shows hepatic steatosis but otherwise normal.  Appendix is normal.  Urine pending.  She now describes her pain as a burning pain which is more consistent with GERD, gastritis.  Improvement after GI cocktail but still having some discomfort.  Will give Protonix.  Anticipate discharge home with instructions for diet changes and prescriptions for omeprazole, Zofran with outpatient PCP follow-up.  Urine shows no sign of infection.   At this time, I do not feel there is any life-threatening condition present. I have reviewed and discussed all results (EKG, imaging, lab, urine as appropriate) and exam findings with patient/family. I have reviewed nursing notes and appropriate previous records.  I feel the patient is safe to be discharged home without further emergent workup and can continue workup as an outpatient as needed. Discussed usual and customary return precautions. Patient/family verbalize understanding and are comfortable with this plan.  Outpatient follow-up has been provided as needed. All questions have been answered.    Zyon Rosser, Layla Maw, DO 03/23/18 [phone]

## 2018-03-23 NOTE — ED Triage Notes (Signed)
Abdominal pain 10 of 10

## 2018-03-23 NOTE — ED Notes (Signed)
ED Provider at bedside. 

## 2018-09-09 IMAGING — US US MFM OB COMPLETE +14 WKS
2 series · 14 of 28 positions shown · non-contrast
Comparison: none

[Series 1: us mfm ob complete +14 wks · 8 of 18 slices shown (1 of 2)]
[im 2/18]
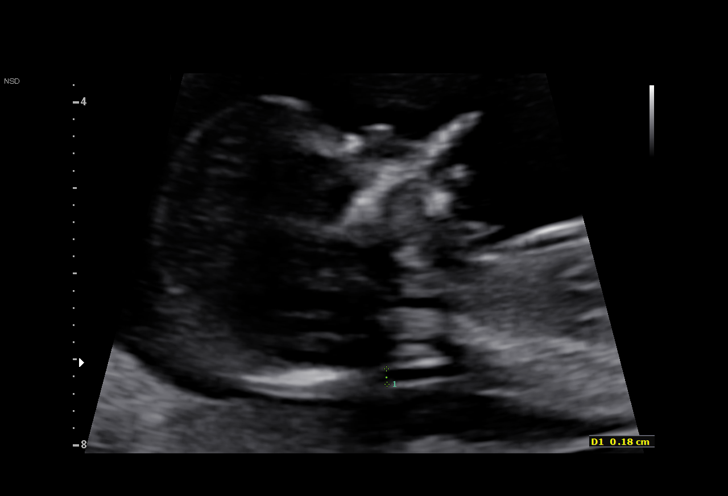
[im 4/18]
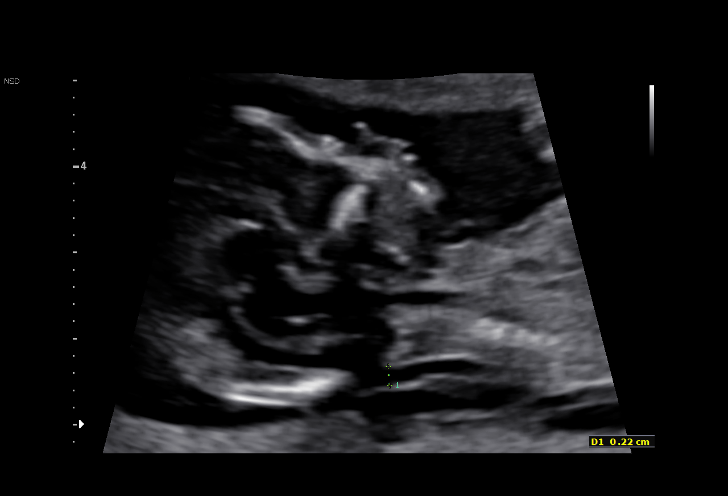
[im 6/18]
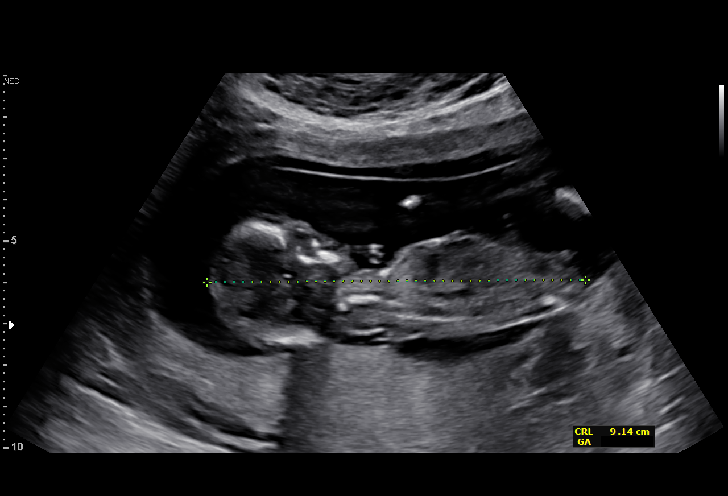
[im 8/18]
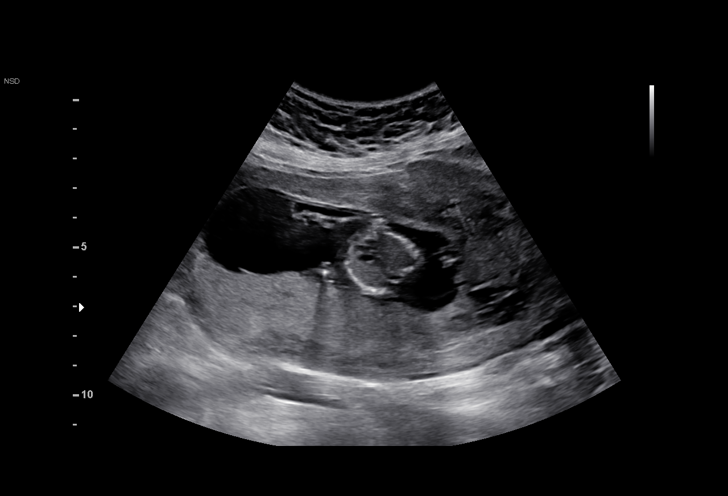
[im 10/18]
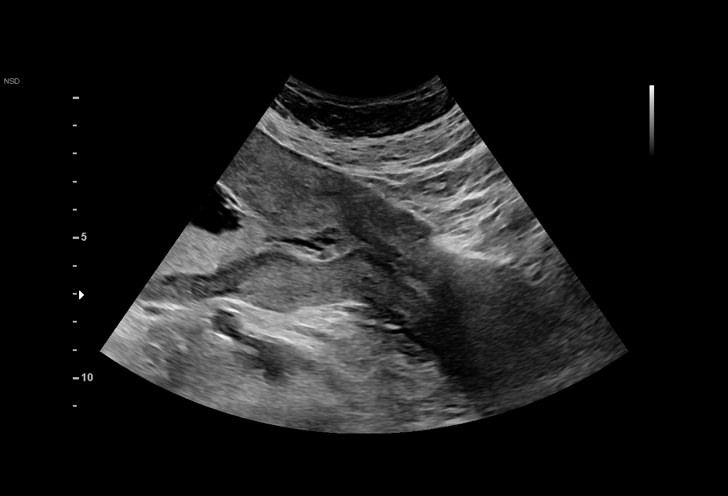
[im 12/18]
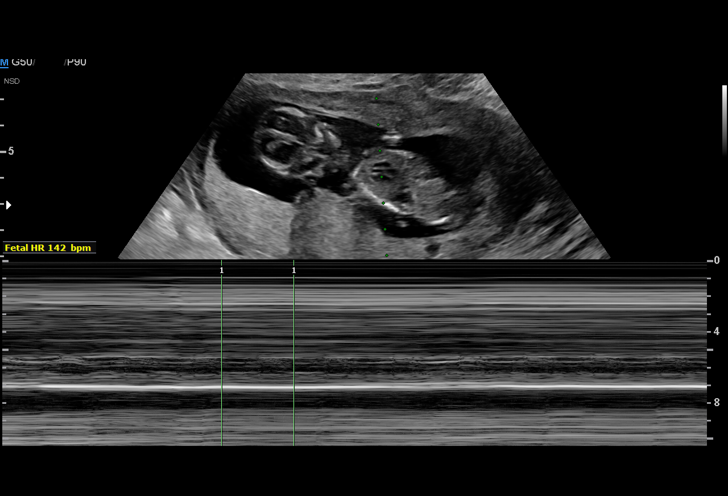
[im 14/18]
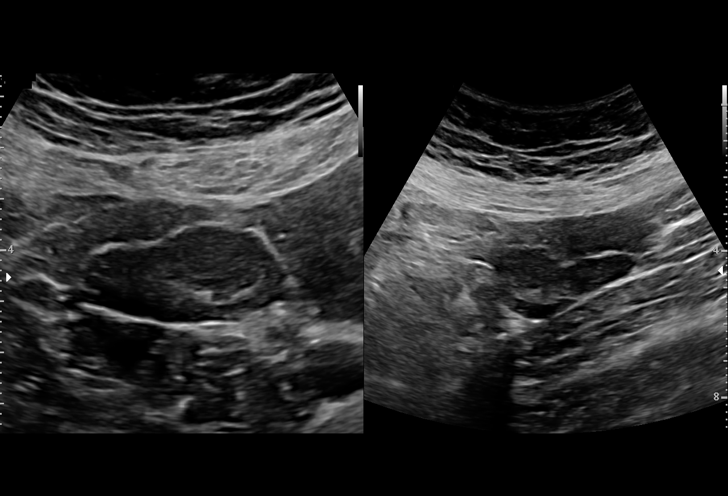
[im 16/18]
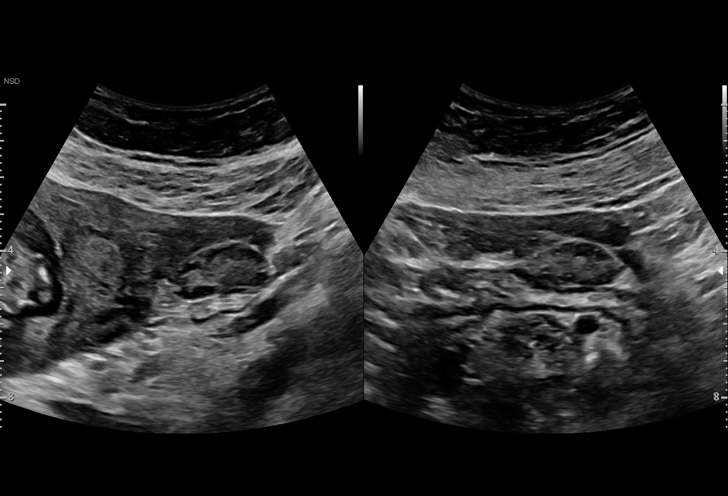

[Series 3: us mfm ob complete +14 wks · 6 of 12 slices shown (2 of 2)]
[im 1/12]
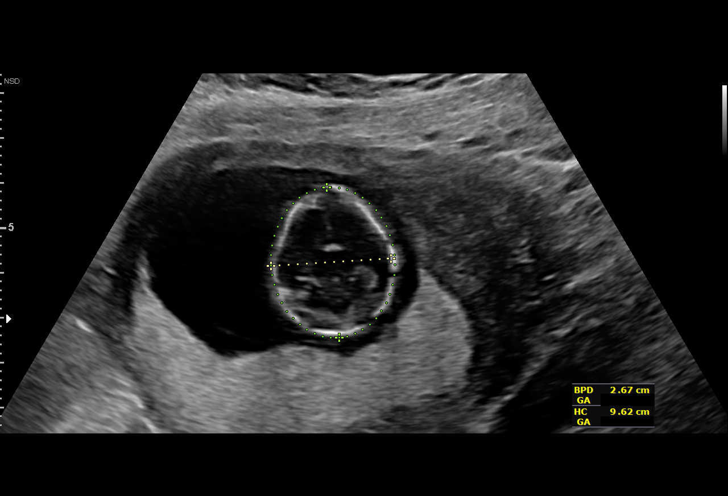
[im 3/12]
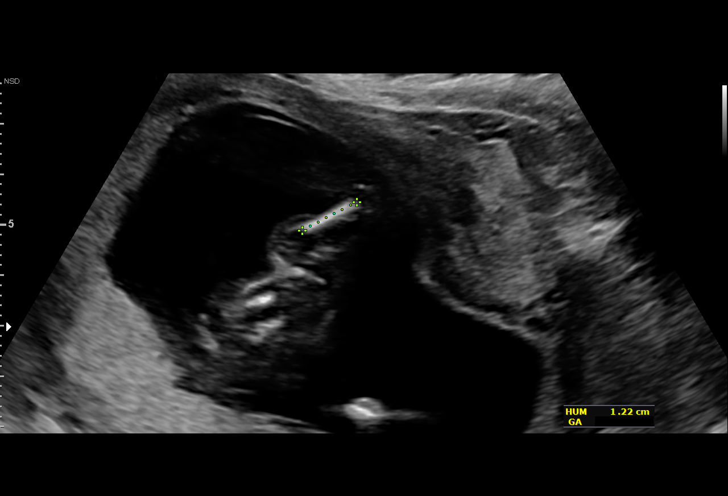
[im 5/12]
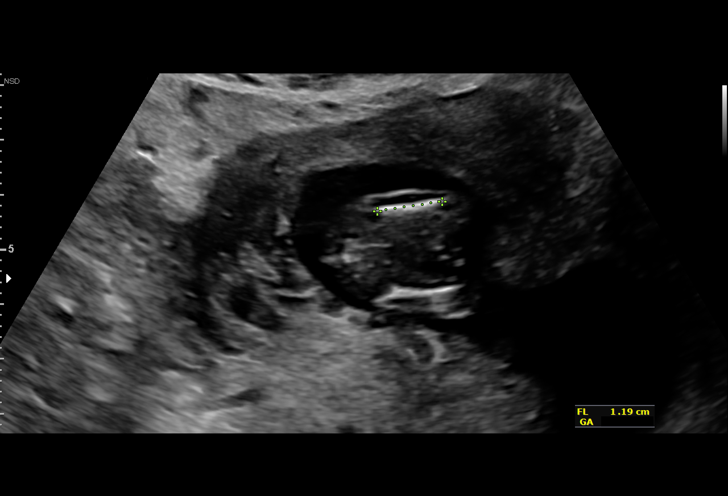
[im 7/12]
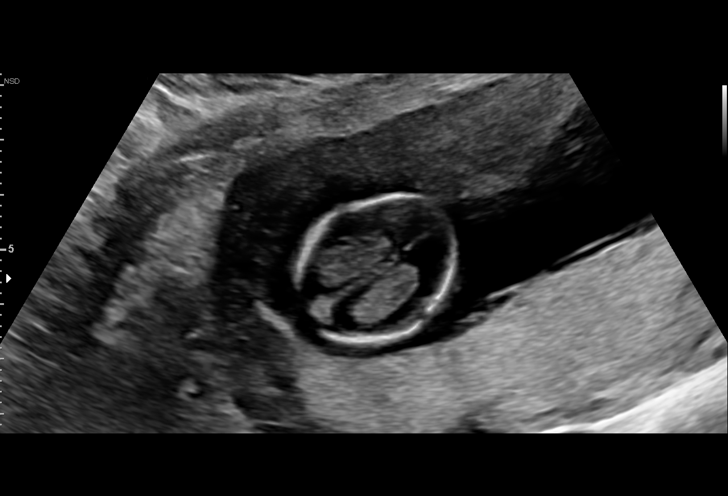
[im 9/12]
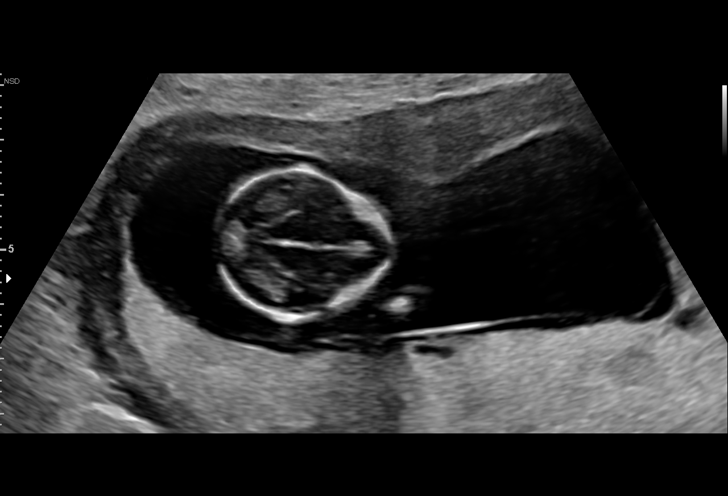
[im 12/12]
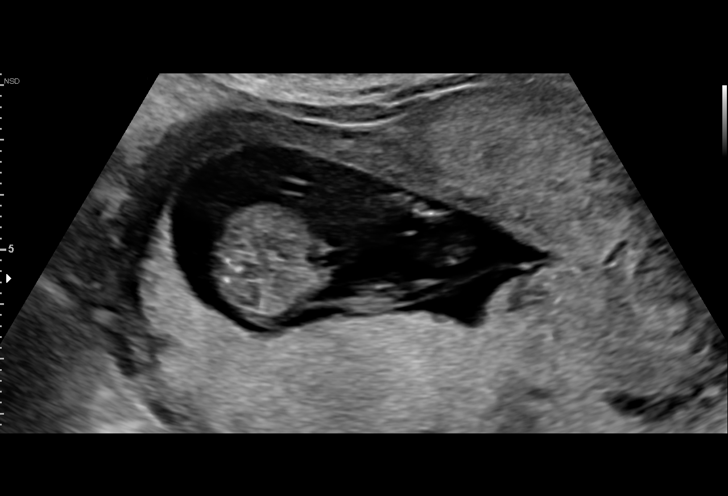

[14 of 28 positions shown; findings below may reference images not displayed]

[REDACTED]
LUIGGI

WEEKS

1  AGAMA GAIBOR                [id number]      [phone]     [id number]
HIDENAGA
Indications

13 weeks gestation of pregnancy
Advanced maternal age multigravida 35+,
first trimester
Poor obstetric history: Previous gestational
diabetes
Poor obstetric history: Previous
preeclampsia / eclampsia/gestational HTN
OB History

Gravidity:    3         Term:   2        Prem:   0        SAB:   0
TOP:          0       Ectopic:  0        Living: 2
Fetal Evaluation

Num Of Fetuses:     1
Fetal Heart         142
Rate(bpm):
Cardiac Activity:   Observed
Presentation:       Breech
Placenta:           Posterior

Amniotic Fluid
AFI FV:      Subjectively within normal limits
Biometry

CRL:        91  mm     G. Age:  N/A                     EDD:

BPD:      26.7  mm     G. Age:  14w 5d                  CI:         76.8   %   70 - 86
FL/HC:      12.3   %
HC:       96.5  mm     G. Age:  14w 3d                  HC/AC:      1.28       1.14 -
AC:       75.2  mm     G. Age:  14w 0d                  FL/BPD:     44.6   %
FL:       11.9  mm     G. Age:  13w 3d                  FL/AC:      15.8   %   20 - 24
HUM:      12.2  mm     G. Age:  13w 1d

Est. FW:      84  gm      0 lb 3 oz
Gestational Age

LMP:           13w 6d       Date:   09/26/15                 EDD:   07/02/16
U/S Today:     14w 1d                                        EDD:   06/30/16
Best:          13w 6d    Det. By:   LMP  (09/26/15)          EDD:   07/02/16
Anatomy

Choroid Plexus:        Appears normal         Upper Extremities:      Visualized
Stomach:               Appears normal, left   Lower Extremities:      Visualized
sided
Abdominal Wall:        Appears nml (cord
insert, abd wall)
Cervix Uterus Adnexa

Cervix
Normal appearance by transabdominal scan.

Left Ovary
Within normal limits.

Right Ovary
Within normal limits.

Adnexa:       No abnormality visualized.
Impression

Single IUP at 13w 6d
Unable to perform first trimester screen (CRL beyond
gestational window)
Appears normal.  Nasal bone visualized.
Recommendations

See separate note from Genetics counselor.
Recommend follow up in 4 weeks for detailed anatomy.

## 2018-12-17 IMAGING — US US ABDOMEN LIMITED
1 series · 15 of 25 positions shown · non-contrast
Comparison: None.

CLINICAL DATA: Right upper quadrant pain, epigastric pain, 28 weeks
pregnant, nausea and vomiting this morning

EXAM:
US ABDOMEN LIMITED - RIGHT UPPER QUADRANT

[Series 1: us abdomen limited · 15 of 71 slices shown]
[im 1/71]
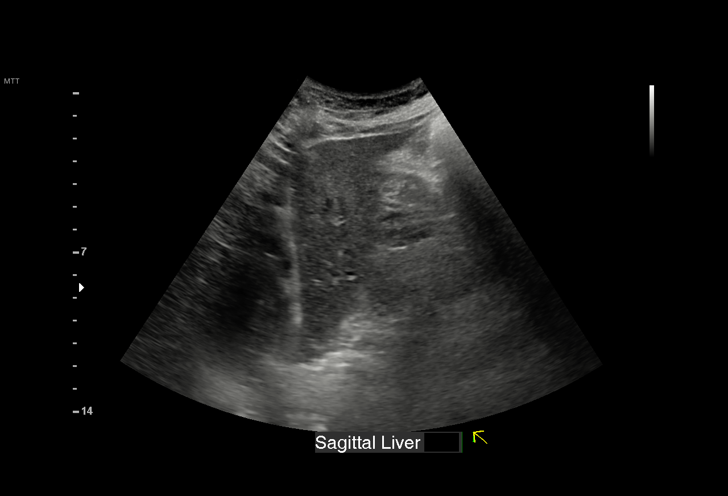
[im 6/71]
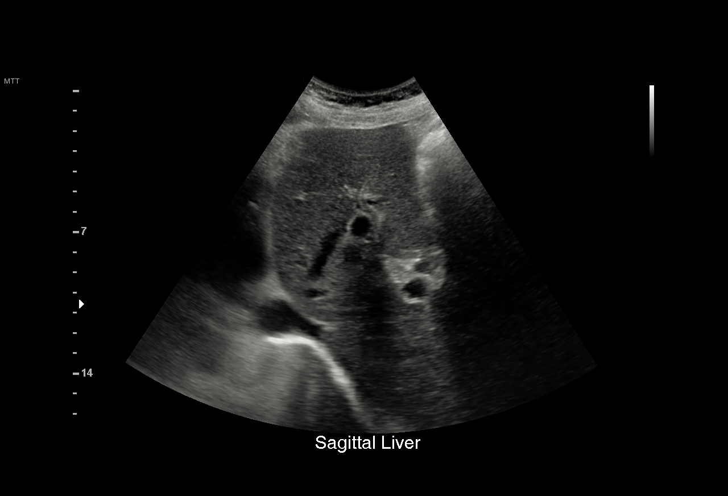
[im 12/71]
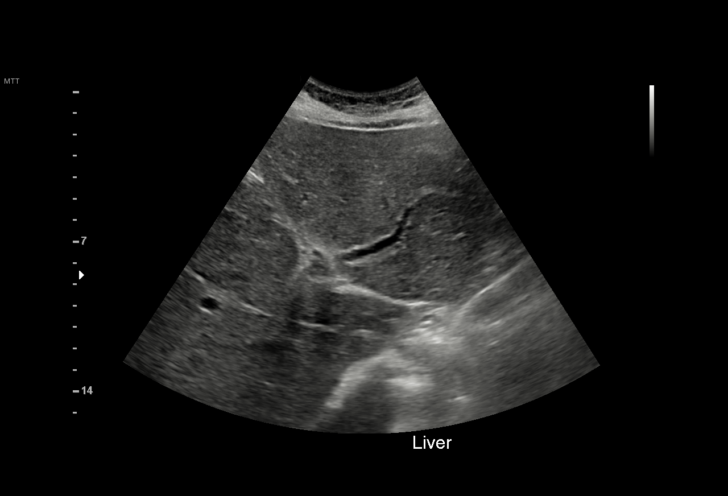
[im 15/71]
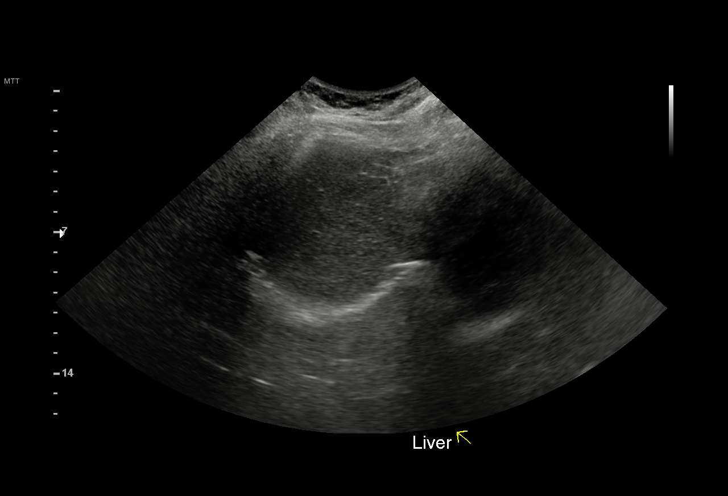
[im 21/71]
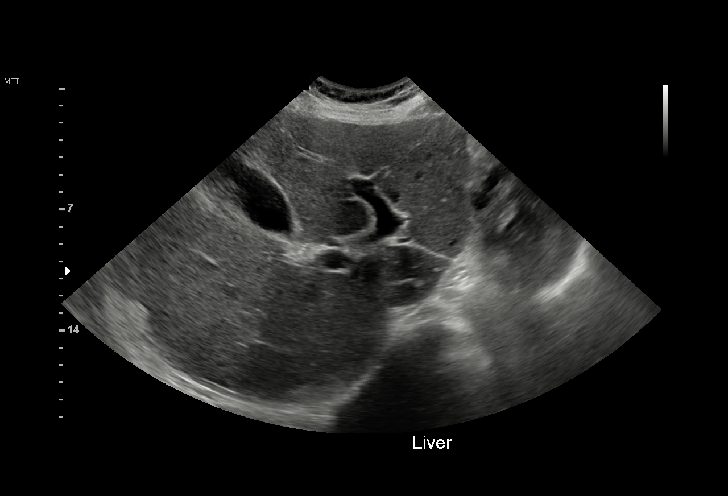
[im 27/71]
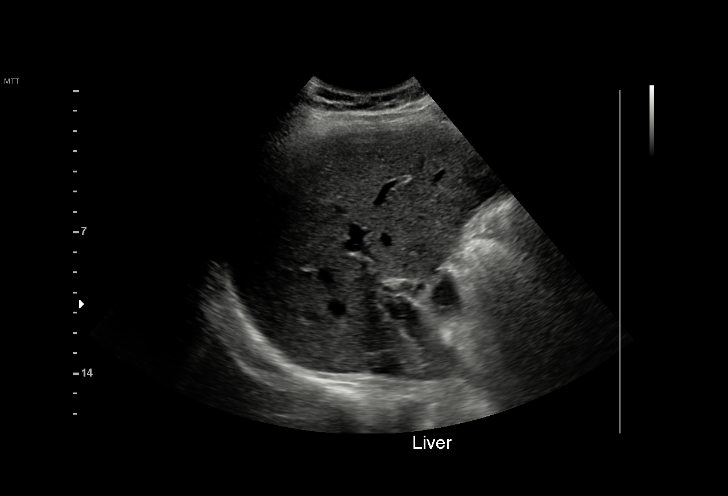
[im 30/71]
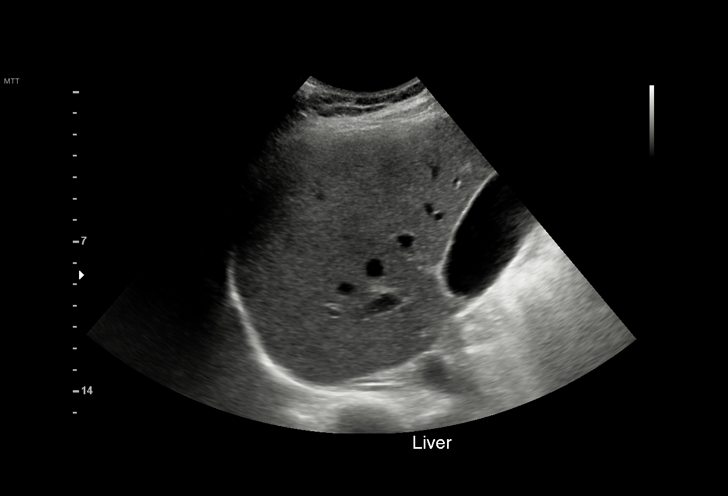
[im 36/71]
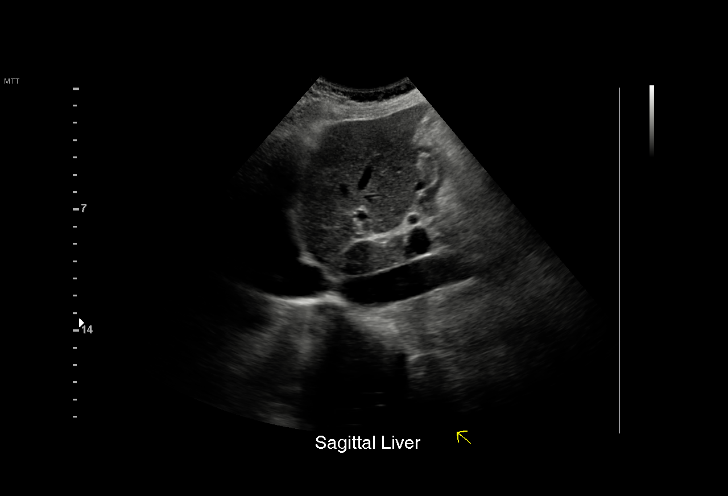
[im 41/71]
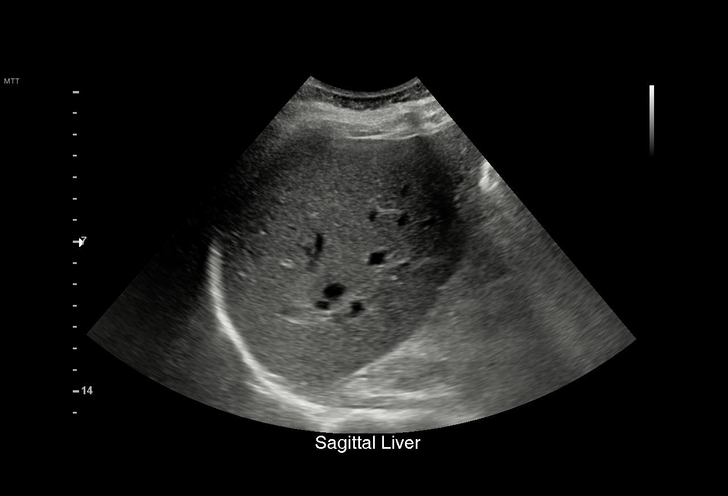
[im 44/71]
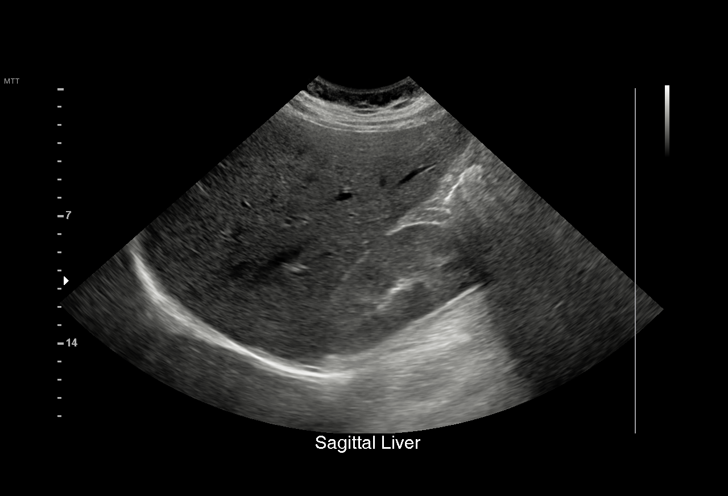
[im 50/71]
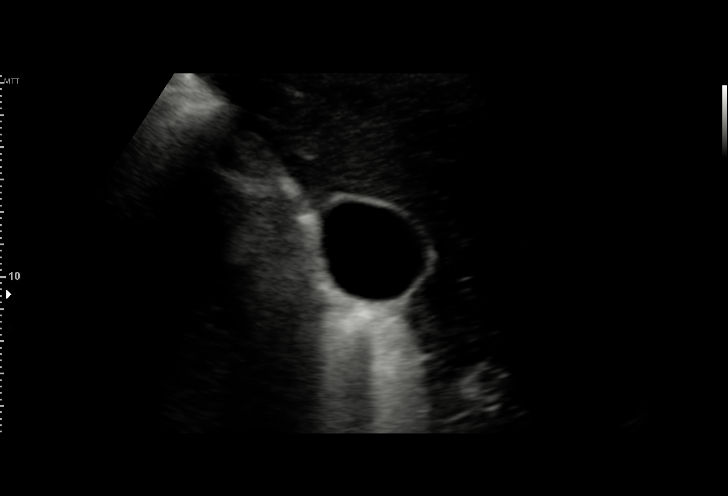
[im 56/71]
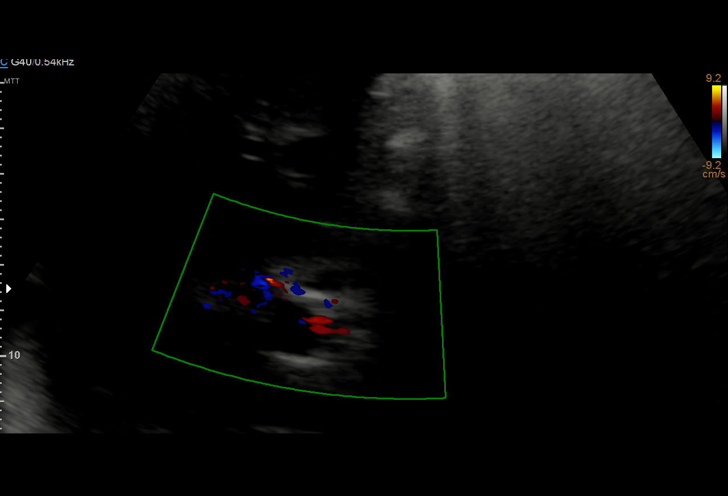
[im 59/71]
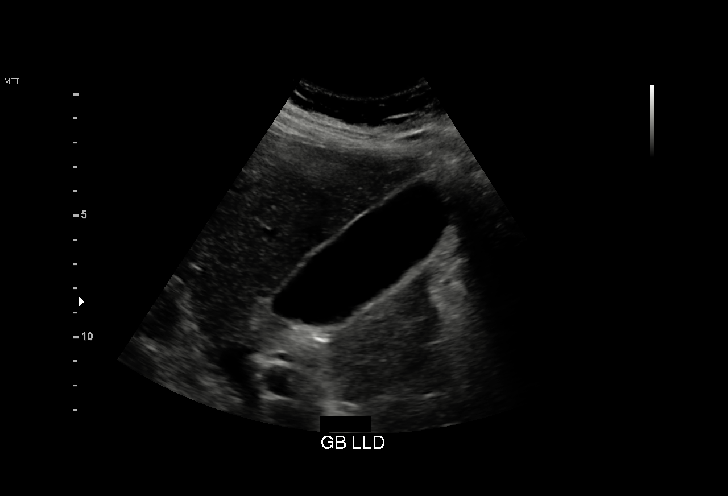
[im 65/71]
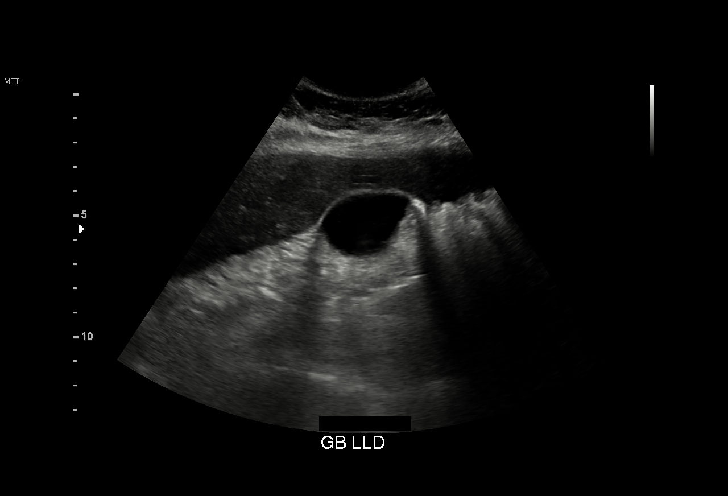
[im 71/71]
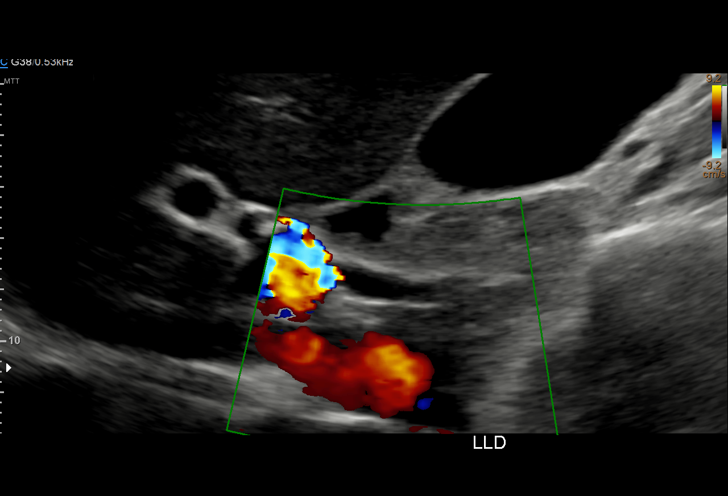

[15 of 25 positions shown; findings below may reference images not displayed]

FINDINGS: Gallbladder:

No gallstones or wall thickening visualized. No sonographic Murphy
sign noted by sonographer.

Common bile duct:

Diameter: 4.6 mm in diameter within normal limits

Liver:

No focal lesion identified. Within normal limits in parenchymal
echogenicity.
IMPRESSION: No gallstones are noted within gallbladder. No sonographic Murphy's
sign. Normal CBD. Normal liver echogenicity.

## 2019-01-25 IMAGING — US US MFM FETAL BPP W/O NON-STRESS
1 series · 12 of 28 positions shown · non-contrast
Comparison: none

[Series 1: us mfm fetal bpp w/o non-stress · 28 acquisitions, 12 frames shown]
[im 2/28]
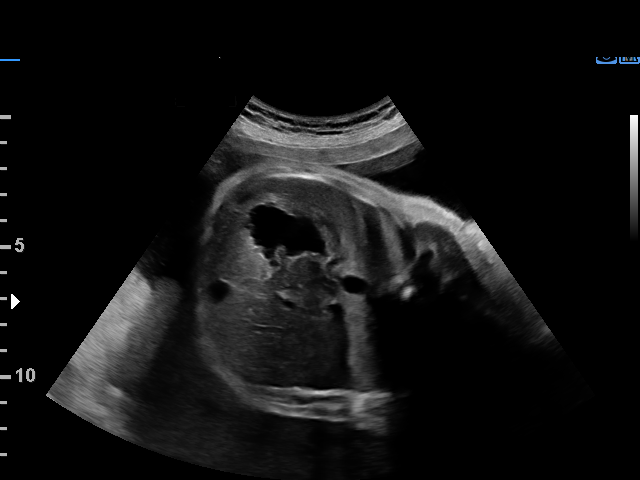
[im 4/28]
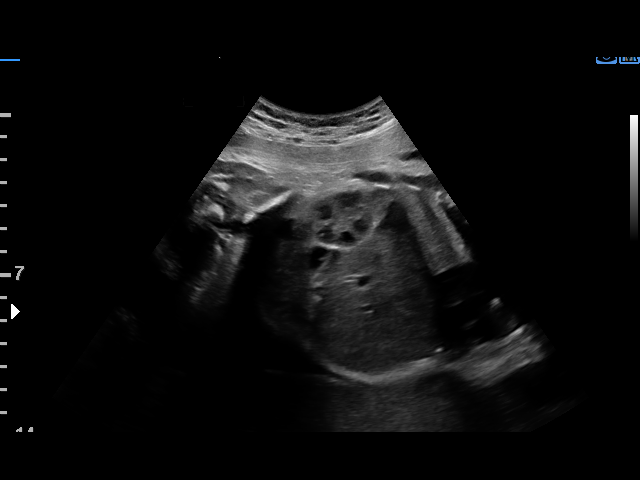
[im 6/28]
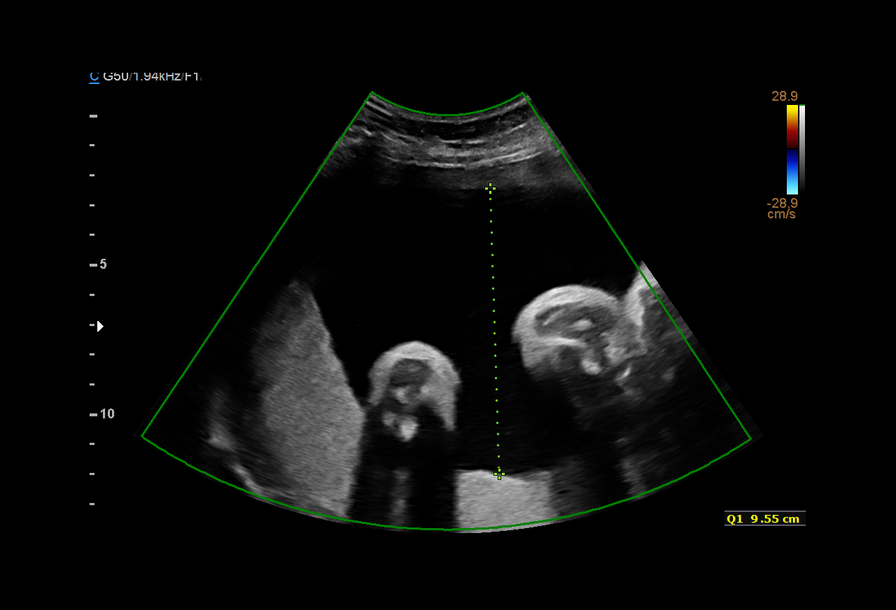
[im 9/28]
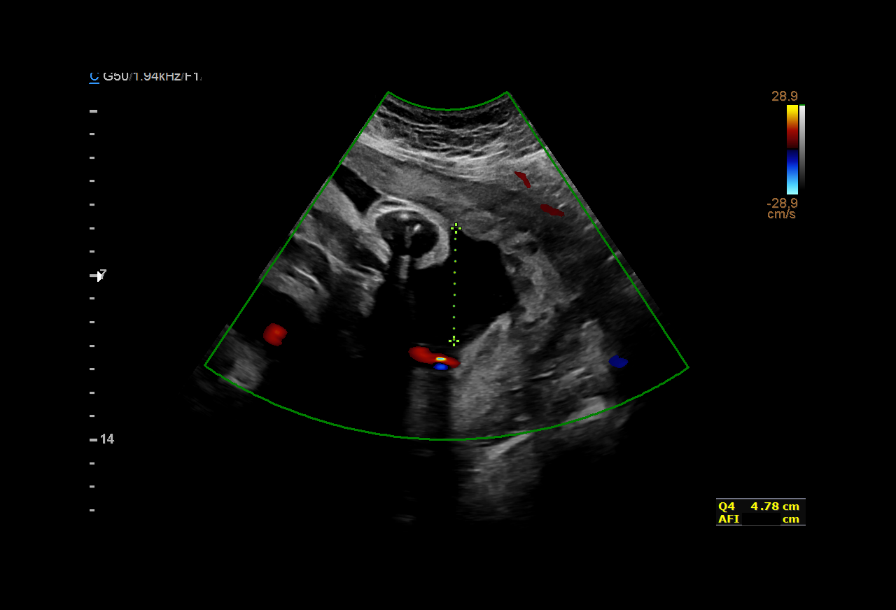
[im 11/28]
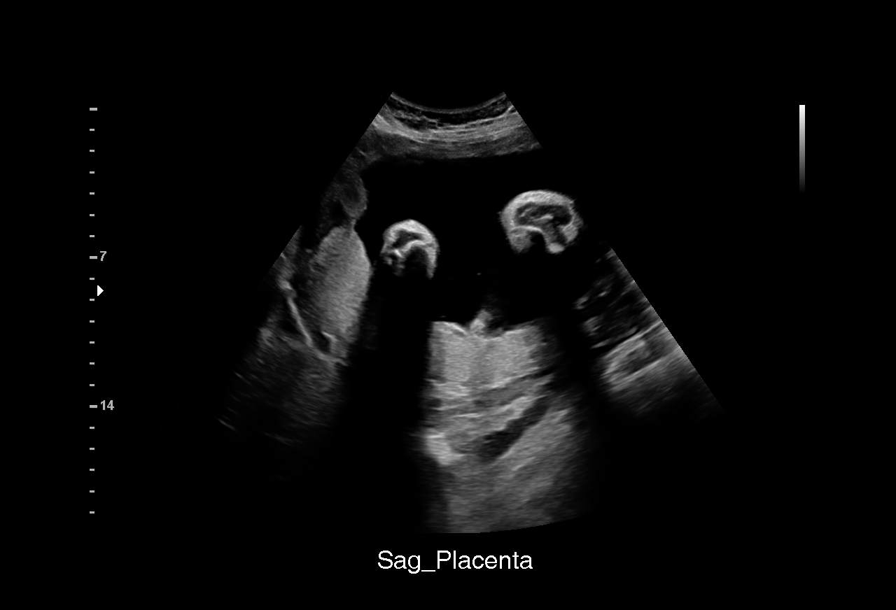
[im 13/28]
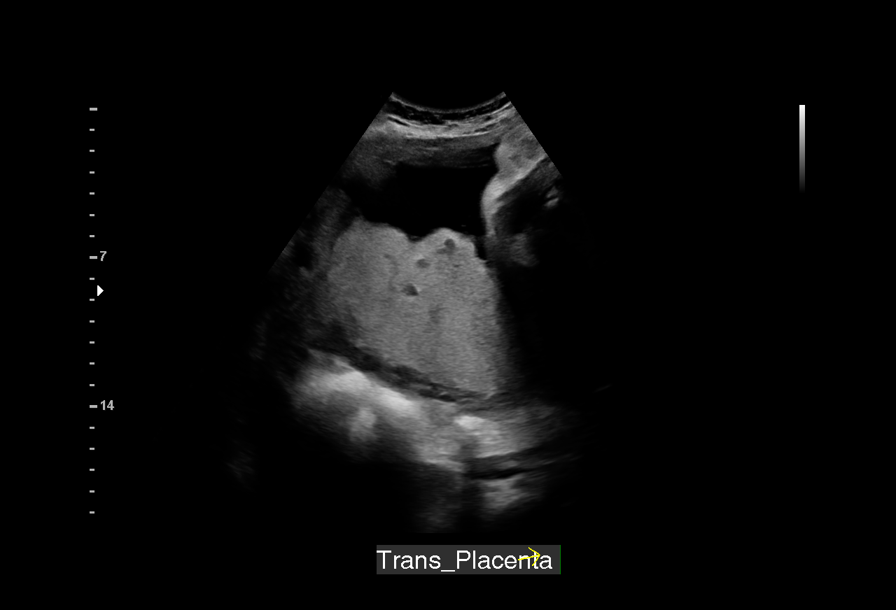
[im 16/28]
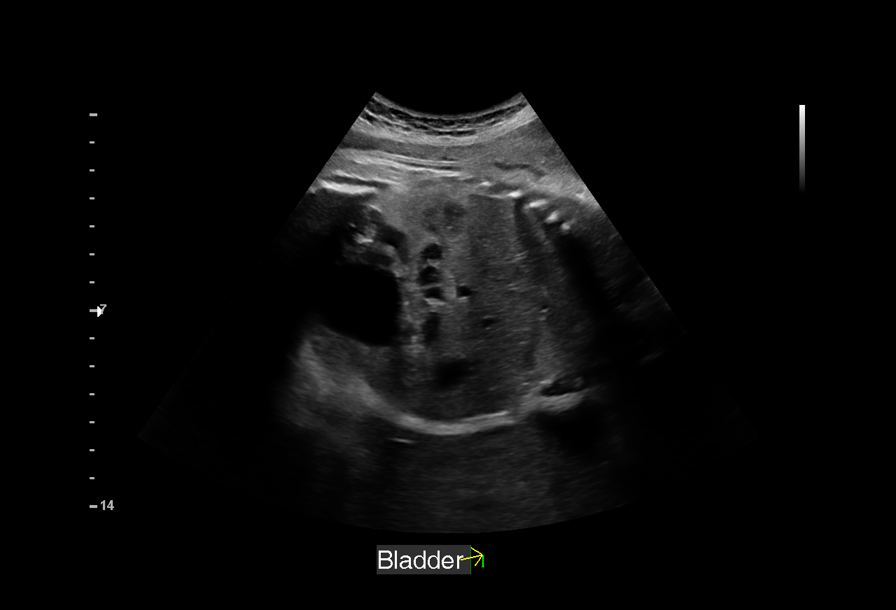
[im 18/28]
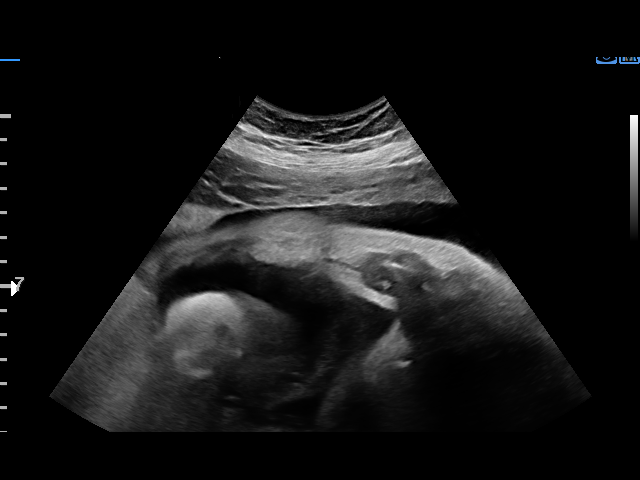
[im 20/28]
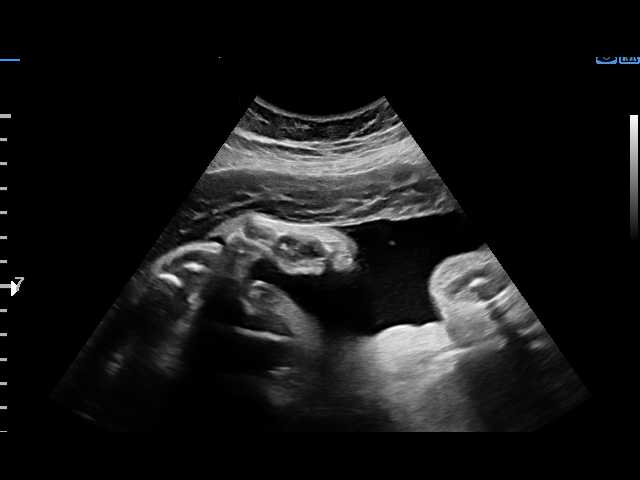
[im 23/28]
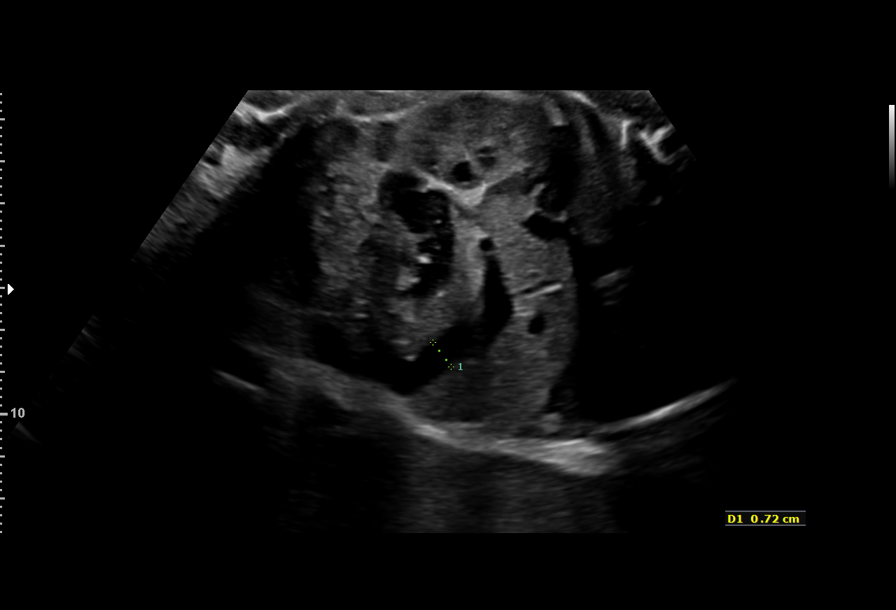
[im 25/28]
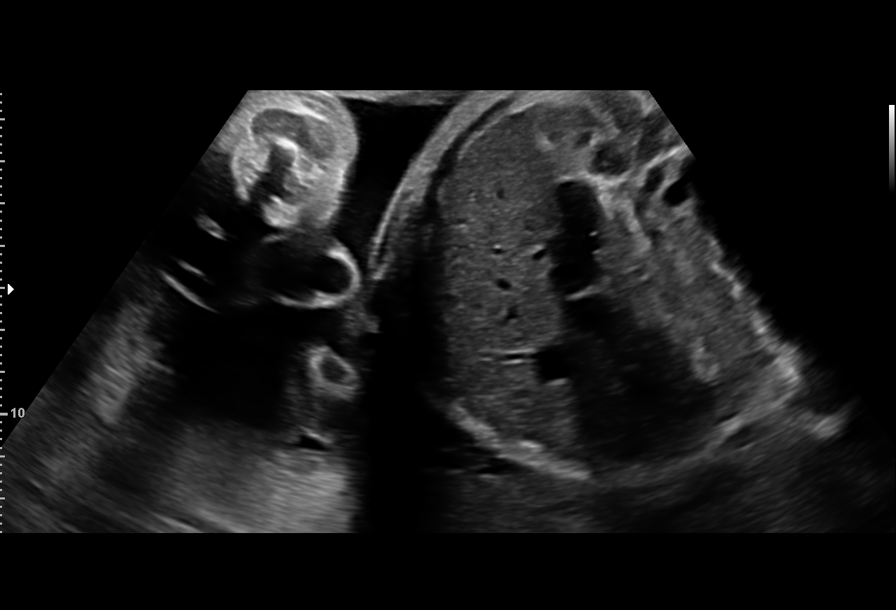
[im 27/28]
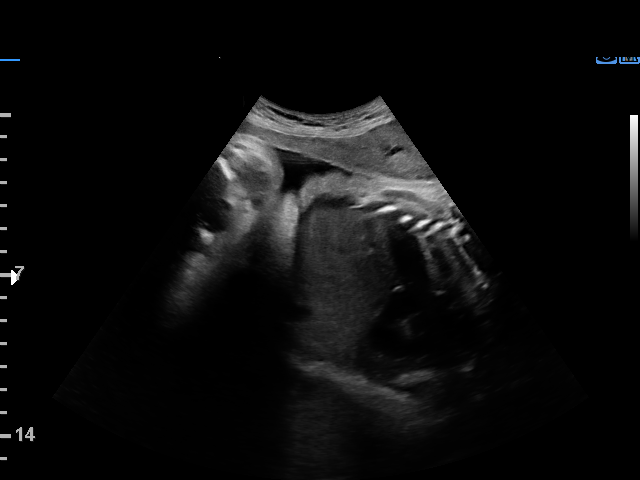

[12 of 28 positions shown; findings below may reference images not displayed]

[facility]

OB/Gyn Clinic

1  NIKITA SALINAS              [id number]      [id number]     [id number]
Indications

33 weeks gestation of pregnancy
Advanced maternal age multigravida 35+,
first trimester; high risk quad: 1 in 184 for
T21; low risk NIPS
Poor obstetric history: Previous gestational
diabetes
Poor obstetric history: Previous
preeclampsia / eclampsia/gestational HTN
Abnormal biochemical screen (quad screen:
DSR 1 in 184)
Umbilical vein abnormality complicating
pregnancy
OB History

Gravidity:    3         Term:   2        Prem:   0        SAB:   0
TOP:          0       Ectopic:  0        Living: 2
Fetal Evaluation

Num Of Fetuses:     1
Fetal Heart         151
Rate(bpm):
Cardiac Activity:   Observed
Presentation:       Cephalic
Placenta:           Posterior, above cervical os
P. Cord Insertion:  Previously Visualized
Amniotic Fluid
AFI FV:      Subjectively upper-normal

AFI Sum(cm)     %Tile       Largest Pocket(cm)
19.87           74

RUQ(cm)       RLQ(cm)       LUQ(cm)        LLQ(cm)
9.55
Biophysical Evaluation

Amniotic F.V:   Pocket => 2 cm two         F. Tone:        Observed
planes
F. Movement:    Observed                   Score:          [DATE]
F. Breathing:   Observed
Gestational Age

LMP:           33w 4d       Date:   09/26/15                 EDD:   07/02/16
Best:          33w 4d    Det. By:   LMP  (09/26/15)          EDD:   07/02/16
Impression

Single living intrauterine pregnancy at 33+4 weeks with
umbilical vein varix, AMA and abnormal maternal serum
marker screen; here for BPP.
Normal amniotic fluid volume.
An umbilical vein varix is still present. There is no evidence of
a filling defect.
BPP [DATE].
Recommendations

Follow up evaluation twice weekly as scheduled

## 2019-02-15 ENCOUNTER — Other Ambulatory Visit (HOSPITAL_COMMUNITY): Payer: Self-pay

## 2019-02-15 DIAGNOSIS — N644 Mastodynia: Secondary | ICD-10-CM

## 2019-02-21 ENCOUNTER — Other Ambulatory Visit: Payer: Self-pay

## 2019-02-21 ENCOUNTER — Ambulatory Visit: Payer: Self-pay | Admitting: Advanced Practice Midwife

## 2019-02-21 ENCOUNTER — Encounter: Payer: Self-pay | Admitting: Advanced Practice Midwife

## 2019-02-21 ENCOUNTER — Ambulatory Visit
Admission: RE | Admit: 2019-02-21 | Discharge: 2019-02-21 | Disposition: A | Discharge status: Home / Self Care | Payer: No Typology Code available for payment source | Source: Ambulatory Visit | Attending: Obstetrics and Gynecology | Admitting: Obstetrics and Gynecology

## 2019-02-21 VITALS — BP 130/88 | Temp 97.8°F | Wt 170.0 lb

## 2019-02-21 DIAGNOSIS — Z124 Encounter for screening for malignant neoplasm of cervix: Secondary | ICD-10-CM

## 2019-02-21 DIAGNOSIS — Z0000 Encounter for general adult medical examination without abnormal findings: Secondary | ICD-10-CM

## 2019-02-21 DIAGNOSIS — N644 Mastodynia: Secondary | ICD-10-CM

## 2019-02-21 NOTE — Addendum Note (Signed)
Addended by: Narda Rutherford on: 02/21/2019 10:01 AM   Modules accepted: Orders

## 2019-02-21 NOTE — Progress Notes (Signed)
Ms. Dawnmarie Breon is a 39 y.o. [phone] female who presents to [facility] clinic today with no complaints.    Pap Smear: Pap smear completed today. Last Pap smear was 12/12/2015 at [facility] clinic and was normal. Per patient has history of an abnormal Pap smear. Last Pap smear result is not available in Epic.   Physical exam: Breasts Breasts symmetrical. No skin abnormalities bilateral breasts. No nipple retraction bilateral breasts. No nipple discharge bilateral breasts. No lymphadenopathy. No lumps palpated bilateral breasts.       Pelvic/Bimanual Ext Genitalia No lesions, no swelling and no discharge observed on external genitalia.        Vagina Vagina pink and normal texture. No lesions or discharge observed in vagina.        Cervix Cervix is present. Cervix pink and of normal texture. No discharge observed.    Uterus Uterus is present and palpable. Uterus in normal position and normal size.        Adnexae Bilateral ovaries present and palpable. No tenderness on palpation.         Rectovaginal No rectal exam completed today since patient had no rectal complaints. No skin abnormalities observed on exam.     Smoking History: Patient has never smoked   Patient Navigation: Patient education provided. Access to services provided for patient through [facility] program. Spanish interpreter provided.    Colorectal Cancer Screening: Per patient has never had colonoscopy completed No complaints today.    Breast and Cervical Cancer Risk Assessment: Patient does not have family history of breast cancer, known genetic mutations, or radiation treatment to the chest before age 32. Patient has history of cervical dysplasia, immunocompromised, or DES exposure in-utero.  Risk Assessment    No risk assessment data      A: BCCCP exam with pap smear Complaint of occasional nipple pain. Patient is mid-cycle at this time, and discussed with patient that this is likely related to cycle.    P: Referred patient to [facility] for a screening mammogram. Appointment scheduled 02/21/2019.  Thressa Sheller DNP, CNM  02/21/19  9:44 AM

## 2019-02-22 ENCOUNTER — Other Ambulatory Visit: Payer: Self-pay

## 2019-02-22 ENCOUNTER — Telehealth: Payer: Self-pay

## 2019-02-22 LAB — CYTOLOGY - PAP
Adequacy: ABSENT
Comment: NEGATIVE
Diagnosis: NEGATIVE
High risk HPV: NEGATIVE

## 2019-02-22 MED ORDER — FLUCONAZOLE 150 MG PO TABS: 150.0000 mg | ORAL_TABLET | Freq: Once | ORAL | 0 refills | Status: AC

## 2019-02-22 NOTE — Telephone Encounter (Signed)
Via Raynelle Fanning, Spanish Interpreter, patient informed negative Pap results, positive for yeast infection. [facility] Dr. Jolayne Panther.

## 2019-07-20 ENCOUNTER — Other Ambulatory Visit: Payer: Self-pay

## 2019-07-20 ENCOUNTER — Ambulatory Visit (HOSPITAL_COMMUNITY)
Admission: EM | Admit: 2019-07-20 | Discharge: 2019-07-20 | Disposition: A | Discharge status: Home / Self Care | Payer: Self-pay | Attending: Family Medicine | Admitting: Family Medicine

## 2019-07-20 ENCOUNTER — Encounter (HOSPITAL_COMMUNITY): Payer: Self-pay | Admitting: Emergency Medicine

## 2019-07-20 DIAGNOSIS — R519 Headache, unspecified: Secondary | ICD-10-CM | POA: Insufficient documentation

## 2019-07-20 DIAGNOSIS — B349 Viral infection, unspecified: Secondary | ICD-10-CM | POA: Insufficient documentation

## 2019-07-20 LAB — POCT RAPID STREP A: Streptococcus, Group A Screen (Direct): NEGATIVE

## 2019-07-20 MED ORDER — BUTALBITAL-APAP-CAFFEINE 50-325-40 MG PO TABS: 1.0000 | ORAL_TABLET | Freq: Four times a day (QID) | ORAL | 0 refills | Status: DC | PRN

## 2019-07-20 NOTE — ED Triage Notes (Signed)
Headache for 3 days, sweats, light sensitivity, pressure in chest, patient has been fatigued

## 2019-07-20 NOTE — ED Provider Notes (Signed)
[facility]    CSN: [id number] Arrival date & time: 07/20/19  1324      History   Chief Complaint Chief Complaint  Patient presents with   Headache    HPI [name] Hull is a 39 y.o. female.   HPI  Patient is here for headache.  Also has some tiredness and pressure in her chest, feeling achy.  She is also had some sweats and thinks she has a fever but does not take her temperature at home.  States she does not have a history of migraines.  In her medical record there is history of headaches in the past, associated with pregnancy and preeclampsia.  Patient is certain that she is not pregnant. No cough or cold symptoms Mild sore throat No head injury or trauma She states she has mild light sensitivity.  No nausea or vomiting.  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 Authorizing Provider  acetaminophen (TYLENOL) 325 MG tablet Take 650 mg by mouth every 6 (six) hours  as needed for mild pain.     [provider]  butalbital-acetaminophen-caffeine (FIORICET) [phone] MG tablet Take 1-2 tablets by mouth every 6 (six) hours as needed for headache. 07/20/19 07/19/20  Eustace Moore, MD  omeprazole (PRILOSEC) 40 MG capsule Take 1 capsule (40 mg total) by mouth daily. Patient not taking: Reported on 02/21/2019 03/23/18 07/20/19  Ward, Layla Maw, DO  sertraline (ZOLOFT) 50 MG tablet Take 1 tablet (50 mg total) by mouth daily. Patient not taking: Reported on 03/23/2018 06/17/16 07/20/19  Marylene Land, CNM    Family History Family History  Problem Relation Age of Onset   Hypertension Mother    Diabetes Mother     Social History Social History   Tobacco Use   Smoking status: Never Smoker   Smokeless tobacco: Never Used  Building services engineer Use: Never used  Substance Use Topics   Alcohol use: No   Drug use: No     Allergies   Patient has no known allergies.   Review of Systems Review of Systems  See HPI Physical Exam Triage Vital Signs ED  Triage Vitals  Enc Vitals Group     BP 07/20/19 0827 110/64     Pulse Rate 07/20/19 0827 79     Resp 07/20/19 0827 18     Temp 07/20/19 0827 98.4 F (36.9 C)     Temp Source 07/20/19 0827 Oral     SpO2 07/20/19 0827 96 %     Weight --      Height --      Head Circumference --      Peak Flow --      Pain Score 07/20/19 0823 8     Pain Loc --      Pain Edu? --      Excl. in GC? --    No data found.  Updated Vital Signs BP 110/64 (BP Location: Right Arm)    Pulse 79    Temp 98.4 F (36.9 C) (Oral)    Resp 18    LMP 06/17/2019    SpO2 96%      Physical Exam Constitutional:      General: She is not in acute distress.    Appearance: She is well-developed and normal weight.  HENT:     Head: Normocephalic and atraumatic.     Mouth/Throat:     Mouth: Mucous membranes are moist.     Comments: Tonsils are moderately large.  Erythematous.  No exudate Eyes:     General:  No visual field deficit.    Extraocular Movements:     Right eye: Normal extraocular motion and no nystagmus.     Left eye: Normal extraocular motion and no nystagmus.     Conjunctiva/sclera: Conjunctivae normal.     Pupils: Pupils are equal, round, and reactive to light.     Right eye: Pupil is round and reactive.     Left eye: Pupil is round and reactive.  Cardiovascular:     Rate and Rhythm: Normal rate and regular rhythm.     Heart sounds: Normal heart sounds.  Pulmonary:     Effort: Pulmonary effort is normal. No respiratory distress.     Breath sounds: Normal breath sounds.  Abdominal:     General: There is no distension.     Palpations: Abdomen is soft.  Musculoskeletal:        General: Normal range of motion.     Cervical back: Normal range of motion.  Lymphadenopathy:     Cervical: Cervical adenopathy present.  Skin:    General: Skin is warm and dry.  Neurological:     Mental Status: She is alert.     Cranial Nerves: No dysarthria or facial asymmetry.     Gait: Gait normal.     Deep Tendon Reflexes: Reflexes normal.  Psychiatric:        Mood and Affect: Mood normal.        Behavior: Behavior normal.      UC Treatments / Results  Labs (all labs ordered are listed, but only abnormal results are displayed) Labs Reviewed  CULTURE, GROUP A STREP [facility])  POCT RAPID STREP A    EKG   Radiology No results found.  Procedures Procedures (including critical care time)  Medications Ordered in UC Medications - No data to display  Initial Impression / Assessment and Plan / UC Course  I have reviewed the triage vital signs and the nursing notes.  Pertinent labs & imaging results that were available during my care of the patient were reviewed by me and considered in my medical decision  making (see chart for details).     Rapid strep is negative. Throat culture is ordered.   Talk to patient about viral illness. Return as needed Final Clinical Impressions(s) /  UC Diagnoses   Final diagnoses:  Bad headache  Viral syndrome     Discharge Instructions     Take headache medication and rest Recommend follow up with PCP    ED Prescriptions    Medication Sig Dispense Auth. Provider   butalbital-acetaminophen-caffeine (FIORICET) 50-325-40 MG tablet Take 1-2 tablets by mouth every 6 (six) hours as needed for headache. 20 tablet Eustace Moore, MD     I have reviewed the PDMP during this encounter.   Eustace Moore, MD 07/20/19 1019

## 2019-07-20 NOTE — Discharge Instructions (Signed)
Take headache medication and rest Recommend follow up with PCP

## 2019-07-22 LAB — CULTURE, GROUP A STREP (THRC)

## 2019-10-09 ENCOUNTER — Other Ambulatory Visit: Payer: Self-pay

## 2019-10-09 ENCOUNTER — Inpatient Hospital Stay: Payer: Self-pay | Attending: Obstetrics and Gynecology | Admitting: *Deleted

## 2019-10-09 VITALS — BP 122/72 | Ht 63.0 in | Wt 191.5 lb

## 2019-10-09 DIAGNOSIS — Z0000 Encounter for general adult medical examination without abnormal findings: Secondary | ICD-10-CM

## 2019-10-09 NOTE — Progress Notes (Signed)
Wisewoman initial screening   Interpreter- Natale Lay, [place]   Clinical Measurement:  Vitals:   10/09/19 0931  BP: (!) 126/58   Fasting Labs Drawn Today, will review with patient when they result.   Medical History:  Patient states that she does not have high cholesterol, does not have high blood pressure and she does not have diabetes.  Medications:  Patient states that she does not take medication to lower cholesterol, blood pressure and blood sugar.  Patient does not take an aspirin a day to help prevent a heart attack or stroke.    Blood pressure, self measurement:  :  Patient states that she does not measure blood pressure from home. She checks her blood pressure N/A. She shares her readings with a health care provider: N/A.   Nutrition: Patient states that on average she eats 3 cups of fruit and 1 cups of vegetables per day. Patient states that she does not eat fish at least 2 times per week. Patient eats less than half servings of whole grains. Patient drinks less than 36 ounces of beverages with added sugar weekly: no. Patient is currently watching sodium or salt intake: yes. In the past 7 days patient has had 0 drinks containing alcohol. On average patient drinks 1 drinks containing alcohol per day.      Physical activity:  Patient states that she gets 0 minutes of moderate and 0 minutes of vigorous physical activity each week.  Smoking status:  Patient states that she has has never smoked .   Quality of life:  Over the past 2 weeks patient states that she had little interest or pleasure in doing things: several days. She has been feeling down, depressed or hopeless:several days.    Risk reduction and counseling:   Health Coaching:   Spoke with patient about the daily recommendation of fruits and vegetables (2 cups fruit, 3 cups vegetables). Gave recommendations for heart healthy fish that patient can add into weekly diet (salmon, tuna, mackerel or sardines). Patient stated  that she does eat salmon. Also explained what whole grains are and the importance of them in a heart healthy diet. Gave recommendations to try whole grain bread, whole wheat pasta, brown rice or whole grain cereals. Patient currently eats oatmeal daily. I also spoke with patient about the weekly recommendation for beverages with added sugars. Patient currently drinks about two sodas per week and fruit juices daily. Encouraged patient to try and cut back to 36 ounces or less per week given her history of gestational diabetes. Patient currently does not exercise outside of the activity that she does at work.    Navigation:  I will notify patient of lab results.  Patient is aware of 2 more health coaching sessions and a follow up.  Time: 20 minutes

## 2019-10-10 LAB — LIPID PANEL
Chol/HDL Ratio: 5.6 ratio — ABNORMAL HIGH (ref 0.0–4.4)
Cholesterol, Total: 202 mg/dL — ABNORMAL HIGH (ref 100–199)
HDL: 36 mg/dL — ABNORMAL LOW (ref >39)
LDL Chol Calc (NIH): 120 mg/dL — ABNORMAL HIGH (ref 0–99)
Triglycerides: 261 mg/dL — ABNORMAL HIGH (ref 0–149)
VLDL Cholesterol Cal: 46 mg/dL — ABNORMAL HIGH (ref 5–40)

## 2019-10-10 LAB — HEMOGLOBIN A1C
Est. average glucose Bld gHb Est-mCnc: 126 mg/dL
Hgb A1c MFr Bld: 6 % — ABNORMAL HIGH (ref 4.8–5.6)

## 2019-10-10 LAB — GLUCOSE, RANDOM: Glucose: 98 mg/dL (ref 65–99)

## 2019-10-16 ENCOUNTER — Telehealth: Payer: Self-pay

## 2019-10-16 NOTE — Telephone Encounter (Signed)
Health coaching 2   interpreter- Natale Lay, [employer]   Labs- 202 cholesterol, 120 LDL cholesterol, 261 triglycerides, 36 HDL cholesterol, 6.0 hemoglobin A1C, 98 mean plasma glucose   Patient understands and is aware of her lab results.   Goals-  1. Decrease the amount of fried and fatty foods consumed. (Grill, bake, broil, or sautee foods instead.) 2. Decrease the amount of red meats consumed. (Substitute for lean protein instead like chicken or fish.) 3. Decrease the amount of full fat dairy products consumed. Look for low-fat/reduce-fat options instead. 4. Decrease the amount of sweets and sugars consumed in both food and drink. Decrease the amount of carbs consumed.  5. Try and start exercising daily for at least 20 minutes a day.     Navigation:  Patient is aware of 1 more health coaching sessions and a follow up. Patient is scheduled for follow-up visit with Internal Medicine on Wednesday, October 18, 2019 8:45 am.   Time- 15 minutes

## 2019-10-18 ENCOUNTER — Ambulatory Visit (INDEPENDENT_AMBULATORY_CARE_PROVIDER_SITE_OTHER): Payer: Self-pay | Admitting: Student

## 2019-10-18 ENCOUNTER — Other Ambulatory Visit: Payer: Self-pay

## 2019-10-18 ENCOUNTER — Encounter: Payer: Self-pay | Admitting: Student

## 2019-10-18 DIAGNOSIS — G43909 Migraine, unspecified, not intractable, without status migrainosus: Secondary | ICD-10-CM

## 2019-10-18 DIAGNOSIS — Z599 Problem related to housing and economic circumstances, unspecified: Secondary | ICD-10-CM

## 2019-10-18 DIAGNOSIS — R7303 Prediabetes: Secondary | ICD-10-CM

## 2019-10-18 DIAGNOSIS — Z789 Other specified health status: Secondary | ICD-10-CM

## 2019-10-18 DIAGNOSIS — E782 Mixed hyperlipidemia: Secondary | ICD-10-CM

## 2019-10-18 DIAGNOSIS — E785 Hyperlipidemia, unspecified: Secondary | ICD-10-CM | POA: Insufficient documentation

## 2019-10-18 DIAGNOSIS — Z603 Acculturation difficulty: Secondary | ICD-10-CM

## 2019-10-18 NOTE — Patient Instructions (Addendum)
[name] Hull. Laurelyn Sickle por permitirnos brindarle atencin hoy. Hoy discutimos sus resultados de laboratorio y su dolor de [place]. No tiene diabetes en este momento, pero corre el riesgo de desarrollarla en el futuro. Todava no estamos comenzando a tomar ningn medicamento para el colesterol, por lo que queremos que realice algunos cambios en la dieta para ayudar a Nurse, mental health.  Tome Tylenol de venta libre para su dolor de [place]. Hganos saber si no mejora.  Le hemos dado la tarjeta para obtener ayuda financiera. Asegrese de hacer una cita con el asesor financiero para que pueda ayudarlo a usted y a sus hijos.  Haga un seguimiento en 3 meses  Si tiene alguna pregunta o inquietud, llame a la clnica de medicina interna al [phone].  Sharrell Ku, MD, MPH [facility]   Acceso a mi carta: [email or web address]   Si an no lo ha hecho, Retail banker COVID 19 Para programar una cita para una opcin de vacuna COVID cualquiera de los siguientes: Vaya a TaxDiscussions.tn Vaya a AdvisorRank.co.uk Llame al [phone] Brett Fairy [phone] y seleccione la opcin 2  Dislipidemia Dyslipidemia La dislipidemia es un desequilibrio de sustancias cerosas parecidas a la grasa (lpidos) en la sangre. El cuerpo necesita lpidos en pequeas cantidades. Con frecuencia, la dislipidemia implica un nivel alto de colesterol o triglicridos, que son tipos de lpidos. Las formas frecuentes de dislipidemia incluyen las siguientes:  Niveles elevados de colesterol LDL. El LDL es el tipo de colesterol que causa la acumulacin de depsitos de grasa (placas) en los vasos sanguneos que transportan la sangre fuera del corazn (arterias).  Niveles bajos de colesterol HDL. El HDL es el tipo de colesterol que brinda proteccin contra las enfermedades cardacas. Los niveles altos de HDL eliminan la  acumulacin de LDL de las arterias.  Niveles altos de triglicridos. Los triglicridos son Neomia Dear sustancia grasa presente en la sangre que se relaciona con la acumulacin de placa en las arterias. Cules son las causas? La dislipidemia primaria es causada por cambios (mutaciones) en los genes que se transmiten a travs de las familias (se heredan). Estas mutaciones causan varios tipos de dislipidemia. La dislipidemia secundaria es causada por elecciones de estilos de vida y enfermedades que ocasionan dislipidemia, tales como:  Seguir una dieta rica en grasa de origen animal.  No hacer suficiente ejercicio fsico.  Tener diabetes, enfermedad renal, enfermedad heptica o enfermedad tiroidea.  Beber grandes cantidades de alcohol.  Tomar ciertos medicamentos. Qu incrementa el riesgo? Tiene ms probabilidades de Aeronautical engineer afeccin si es un hombre mayor o si es una mujer que ha pasado por la menopausia. Otros factores de riesgo son los siguientes:  Tener antecedentes familiares de dislipidemia.  Tomar determinados medicamentos, entre ellos, pldoras anticonceptivas, corticoesteroides, algunos diurticos y betabloqueantes.  Fumar cigarrillos.  Consumir una dieta rica en grasas.  Tener ciertas afecciones mdicas, como diabetes, sndrome de ovario poliqustico (SOP), enfermedad renal, enfermedad heptica o hipotiroidismo.  No hacer ejercicio regularmente.  Tener sobrepeso o ser obeso con demasiada grasa en el abdomen. Cules son los signos o los sntomas? En la [employer], la dislipidemia no causa ningn sntoma. En los [place] graves, los niveles muy altos de lpidos pueden causar:  Protuberancias de grasa debajo de la piel (xantomas).  Un anillo blanco o gris alrededor del centro negro (pupila) del ojo. Los niveles muy altos de triglicridos pueden causar inflamacin del pncreas (pancreatitis). Cmo se diagnostica? Su mdico puede diagnosticar dislipidemia  basndose en un anlisis  de [place] de rutina (anlisis de sangre en [place]). Como la [employer] de las personas no tienen sntomas de la afeccin, este anlisis de sangre (perfil de lpidos) se realiza en adultos mayores de 20 aos y se repite cada 5 aos. En este anlisis, se controla lo siguiente:  Colesterol total. Esto mide la cantidad total de colesterol en la sangre, que incluye el colesterol LDL, el colesterol HDL y los triglicridos. Un valor saludable es inferior a 200.  Colesterol LDL. El valor objetivo de colesterol LDL es diferente para cada persona, en funcin de los factores de riesgo individuales. Consulte al mdico cul debe ser el valor del colesterol LDL para usted.  Colesterol HDL. Un nivel de colesterol HDL de 60 o superior es lo mejor porque ayuda a Health visitor las enfermedades cardacas. Un valor inferior a 40 en los hombres o inferior a 50 en las mujeres aumenta el riesgo de enfermedades cardacas.  Triglicridos. Un valor saludable de triglicridos es inferior a 150. Si su perfil de lpidos es anormal, su mdico puede realizar otros anlisis de [place]. Cmo se trata? El tratamiento depende del tipo de dislipidemia que usted tenga y sus otros factores de riesgo de enfermedades cardacas o accidente cerebrovascular. Su mdico tendr un rango objetivo para sus niveles de lpidos en funcin de esta informacin. Para muchas personas, esta afeccin puede tratarse con cambios en el estilo de vida, tales como dieta y ejercicio. El mdico podra recomendarle que haga lo siguiente:  Hacer ejercicio con regularidad.  Realizar cambios en la dieta.  Si fuma, dejar de hacerlo. Si los cambios en la dieta y la actividad fsica no ayudan a Barista sus objetivos, el mdico tambin puede recetarle medicamentos para disminuir los lpidos. El tipo de medicamento recetado con ms frecuencia disminuye el colesterol LDL (estatinas). Si tiene Publishing copy de triglicridos, su mdico puede  recetarle otro tipo de frmaco (fibratos) o un suplemento de aceite de pescado con omega-3, o ambos. Siga estas indicaciones en su casa:  Comida y bebida  Siga las indicaciones del mdico o el nutricionista respecto de las restricciones para las comidas o las bebidas.  Siga una dieta saludable como se lo haya indicado el mdico. Esto puede ayudarle a Barista y Pharmacologist un peso saludable, reducir el colesterol LDL y aumentar el colesterol HDL. Puede incluir: ? Limitar sus caloras, si tiene sobrepeso. ? Comer ms frutas, verduras, cereales integrales, pescado y carnes magras. ? Limitar las grasas saturadas, las grasas trans y Print production planner.  Si bebe alcohol: ? Limite la cantidad que [place]. ? Est atento a la cantidad de alcohol que hay en las bebidas que toma. En los [place], una medida equivale a una botella de cerveza de 12oz ( ), un vaso de vino de 5oz ( ) o un vaso de una bebida alcohlica de alta graduacin de 1oz (55ml).  No beba alcohol si: ? Su mdico le indica no hacerlo. ? Est embarazada, puede estar embarazada o est tratando de quedar embarazada. Actividad  Haga ejercicio con regularidad. Siga un programa de ejercicio y entrenamiento de fuerza tal como se lo haya indicado el mdico. Pregntele al mdico qu actividades son seguras para usted. El mdico puede recomendarle lo siguiente: ? 30 minutos de [place] de 4 a 6 das por [address] La caminata a paso ligero es un ejemplo de [place]. ? Entrenamiento de fuerza 2 [employer]. Indicaciones generales  No consuma ningn producto que contenga nicotina o tabaco, como cigarrillos, cigarrillos electrnicos y tabaco  de Theatre manager. Si necesita ayuda para dejar de fumar, consulte al mdico.  [employer] de venta libre y los recetados solamente como se lo haya indicado el mdico. Esto incluye los suplementos.  Concurra a todas las visitas de seguimiento como se lo haya indicado el  mdico. Comunquese con un mdico si:  Usted: ? Tiene dificultad para cumplir con su plan de actividad fsica o dieta. ? Le cuesta dejar de fumar o controlar el consumo de alcohol. Resumen  Con frecuencia, la dislipidemia implica un nivel alto de colesterol o triglicridos, que son tipos de lpidos.  El tratamiento depende del tipo de dislipidemia que usted tenga y sus otros factores de riesgo de enfermedades cardacas o accidente cerebrovascular.  Para muchas personas, el tratamiento comienza con cambios en el estilo de vida, tales como dieta y ejercicio.  Su mdico puede recetarle medicamentos para disminuir los lpidos. Esta informacin no tiene Theme park manager el consejo del mdico. Asegrese de hacerle al mdico cualquier pregunta que tenga. Document Revised: 08/25/2017 Document Reviewed: 08/25/2017 Elsevier Patient Education  2020 [employer].

## 2019-10-18 NOTE — Progress Notes (Signed)
   CC: Wisewoman follow up  HPI:  [name] is a 39 y.o. female with no significant past medical history presenting for a wisewoman follow-up to discuss her lab results. Patient endorsed migraine with associated blurry vision and nausea but denies any dizziness, chest pain, abdominal pain, vomiting or palpitations.  Please see problem based charting for evaluation, assessment and plan.  Past Medical History:  Diagnosis Date  . Anxiety   . Depression   . Foot fracture, left    as child  . Gestational diabetes   . History of eclampsia 02/17/2016   Base line PIH labs ordered today 02/17/2016: neg Taking ASA  . Hypertension   . Pregnancy induced hypertension   . Seizure disorder during pregnancy [facility]) 2012   Review of Systems:  All ROS negative otherwise as stated in the HPI  Physical Exam:  General: Pleasant middle-aged woman. No acute distress. Well nourished, well developed. Head: Normocephalic, atraumatic w/o tenderness Cardiac: RRR. No murmurs, rubs or gallops. S1, S2. No lower extremities edema Respiratory: Lungs CTAB. No wheezing or crackles. No increased WOB Abdominal: Soft, symmetric and non tender. No organomegaly. Normal bowel sounds Skin: Warm, dry and intact without rashes or lesions Extremities: Atraumatic. Full ROM. Pulse palpable. Neuro: A&O x 3. Moves all extremities. Psych: Appropriate mood and affect. Normal judgement  Vitals:   10/18/19 0833  BP: (!) 131/52  Pulse: 89  Temp: 98.4 F (36.9 C)  TempSrc: Oral  SpO2: 98%  Weight: 184 lb 14.4 oz (83.9 kg)    Assessment & Plan:   See Encounters Tab for problem based charting.  Patient seen with Dr. Curt Bears, MD, MPH

## 2019-10-18 NOTE — Assessment & Plan Note (Signed)
Patient states she has a history of headaches. Patient describes the headache as all over her head. It is associated with nausea, blurry vision and photosensitivity.  States she has taken Advil with minimal relief. States she last had a migraine 3 years ago when she had her last daughter and she was given medication for it.   Plan: --Advised to take over-the-counter Tylenol for pain --Consider triptan therapy if symptoms do not improve

## 2019-10-18 NOTE — Assessment & Plan Note (Signed)
Patient has a A1c of of 6.0, consistent with prediabetes. Patient has a BMI of 30.77 with hyperlipidemia so is at high risk of developing diabetes.   Plan: --Lifestyle modifications with low carb/low cholesterol diet --Repeat A1c in 6 months

## 2019-10-18 NOTE — Assessment & Plan Note (Signed)
Patient also have financial barriers due to being a single mom of 3 children (a 41-year-old, 89-year-old and a 68-year-old). Patient is currently living with a friend temporarily with her children. States her friend is helping her to find a permanent place to stay.  Patient would like financial assistance.  Plan: --Referred to financial counselor

## 2019-10-18 NOTE — Assessment & Plan Note (Addendum)
Patient presented today to follow-up on her wisewoman lab results.  Lipid panel show dyslipidemia with Tchol 202, Trig 261, LDL 120 and HDL 36. Patient's ASCVD score is 1.4% and no indicated to start statin therapy at this time. Patient agrees to lifestyle modification.  Plan: --Lifestyle modification including eating a healthy diet and exercising daily --Weight management --Follow up in 3 months for repeat lipid panel

## 2019-10-20 NOTE — Progress Notes (Signed)
Internal Medicine Clinic Attending  I saw and evaluated the patient.  I personally confirmed the key portions of the history and exam documented by Dr. Amponsah and I reviewed pertinent patient test results.  The assessment, diagnosis, and plan were formulated together and I agree with the documentation in the resident's note.  

## 2019-10-30 ENCOUNTER — Ambulatory Visit: Payer: No Typology Code available for payment source

## 2020-03-20 ENCOUNTER — Other Ambulatory Visit: Payer: Self-pay | Admitting: Obstetrics and Gynecology

## 2020-03-20 DIAGNOSIS — Z1231 Encounter for screening mammogram for malignant neoplasm of breast: Secondary | ICD-10-CM

## 2020-04-11 ENCOUNTER — Telehealth: Payer: Self-pay

## 2020-04-11 ENCOUNTER — Ambulatory Visit
Admission: RE | Admit: 2020-04-11 | Discharge: 2020-04-11 | Disposition: A | Discharge status: Home / Self Care | Payer: No Typology Code available for payment source | Source: Ambulatory Visit | Attending: Obstetrics and Gynecology | Admitting: Obstetrics and Gynecology

## 2020-04-11 ENCOUNTER — Other Ambulatory Visit: Payer: Self-pay

## 2020-04-11 ENCOUNTER — Ambulatory Visit: Payer: No Typology Code available for payment source | Admitting: *Deleted

## 2020-04-11 VITALS — BP 108/74 | Wt 186.8 lb

## 2020-04-11 DIAGNOSIS — Z1231 Encounter for screening mammogram for malignant neoplasm of breast: Secondary | ICD-10-CM

## 2020-04-11 DIAGNOSIS — Z1239 Encounter for other screening for malignant neoplasm of breast: Secondary | ICD-10-CM

## 2020-04-11 NOTE — Progress Notes (Signed)
Ms. Kennia Vanvorst is a 40 y.o. female who presents to [facility] clinic today with no complaints.    Pap Smear: Pap smear not completed today. Last Pap smear was 02/21/2019 at [facility] clinic and was normal with negative HPV. Per patient has history of an abnormal Pap smear in 2009 that a repeat Pap smear was completed for follow-up. Per patient all Pap smears have been normal and has had at least three normal Pap smears since. Last Pap smear result is available in Epic.   Physical exam: Breasts Breasts symmetrical. No skin abnormalities bilateral breasts. No nipple retraction bilateral breasts. No nipple discharge bilateral breasts. No lymphadenopathy. No lumps palpated bilateral breasts. No complaints of pain or tenderness.       MS DIGITAL DIAG TOMO BILAT  Result Date: 02/21/2019 CLINICAL DATA:  40 year old presenting with RIGHT nipple pain which began approximately 2-3 weeks ago, which the patient states has recently resolved. This is the patient's initial baseline mammogram. EXAM: DIGITAL DIAGNOSTIC BILATERAL MAMMOGRAM WITH CAD AND TOMO ULTRASOUND RIGHT BREAST COMPARISON:  None. ACR Breast Density Category b: There are scattered areas of fibroglandular density. FINDINGS: Tomosynthesis and synthesized full field CC and MLO views of both breasts were obtained. Tomosynthesis and synthesized spot compression view of the subareolar location of the RIGHT breast was also obtained. No findings suspicious for malignancy in either breast. Specifically, no mammographic abnormality in the subareolar RIGHT breast to explain the patient's prior nipple pain. Mammographic images were processed with CAD. Targeted RIGHT breast ultrasound is performed, showing normal fibroglandular tissue in the subareolar location. No cyst, solid mass, duct ectasia or intraductal mass is identified. IMPRESSION: 1. No mammographic or sonographic evidence of malignancy involving the RIGHT breast. 2. No mammographic evidence of malignancy  involving the LEFT breast. RECOMMENDATION: Screening mammogram in one year.(Code:SM-B-01Y) I have discussed the findings and recommendations with the patient. Communication with the patient was achieved with the assistance of a certified interpreter. If applicable, a reminder letter will be sent to the patient regarding the next appointment. BI-RADS CATEGORY  1: Negative. Electronically Signed   By: Hulan Saas M.D.   On: 02/21/2019 14:20    Pelvic/Bimanual Pap is not indicated today per BCCCP guidelines.   Smoking History: Patient has never smoked.    Patient Navigation: Patient education provided. Access to services provided for patient through [place] program. Spanish interpreter Natale Lay from [facility] provided.    Breast and Cervical Cancer Risk Assessment: Patient does not have family history of breast cancer, known genetic mutations, or radiation treatment to the chest before age 44. Patient does not have history of cervical dysplasia, immunocompromised, or DES exposure in-utero.  Risk Assessment    Risk Scores      04/11/2020   Last edited by: Meryl Dare, CMA   5-year risk: 0.4 %   Lifetime risk: 6.9 %          A: BCCCP exam without pap smear No complaints.  P: Referred patient to the [facility] for a screening mammogram on mobile unit. Appointment scheduled Thursday, April 11, 2020 at 1110.  Priscille Heidelberg, RN 04/11/2020 9:49 AM

## 2020-04-11 NOTE — Telephone Encounter (Signed)
Health Coaching 3  interpreter- Natale Lay, [employer]   Goals- Patient states that she has been trying to exercise more. She has also been trying to eat healthier but still struggles at times with eating foods that she knows aren't healthy. Patient has lost a few pounds since her initial screening.    New goal- Continue trying to eat a healthy balanced diet. Try to avoid sweets and sugars when possible and watch the amount of carbs consumed.   Barrier to reaching goal- NA    Strategies to overcome- NA   Navigation:  Patient is aware of  a follow up session. Patient is scheduled for follow-up appointment on Wednesday, May 15, 2020.   Time- 10 minutes

## 2020-04-11 NOTE — Patient Instructions (Signed)
Explained breast self awareness with Laurelyn Sickle. Patient did not need a Pap smear today due to last Pap smear and HPV Typing was 02/21/2019. Let her know BCCCP will cover Pap smears and HPV typing every 5 years unless has a history of abnormal Pap smears. Referred patient to the [facility] for a screening mammogram on mobile unit. Appointment scheduled Thursday, April 11, 2020 at 1110. Patient escorted to the mobile unit following BCCCP appointment for her screening mammogram. Let patient know the Breast Center will follow up with her within the next couple weeks with results of her mammogram by letter or phone. Laurelyn Sickle verbalized understanding.  [name] Hull, [name] Maser, RN 9:49 AM

## 2020-05-15 ENCOUNTER — Ambulatory Visit: Payer: Self-pay

## 2020-05-27 ENCOUNTER — Inpatient Hospital Stay: Payer: Self-pay | Attending: Obstetrics and Gynecology | Admitting: *Deleted

## 2020-05-27 ENCOUNTER — Other Ambulatory Visit: Payer: Self-pay

## 2020-05-27 VITALS — BP 130/90 | Ht 63.0 in | Wt 187.4 lb

## 2020-05-27 DIAGNOSIS — Z0000 Encounter for general adult medical examination without abnormal findings: Secondary | ICD-10-CM

## 2020-05-27 NOTE — Progress Notes (Signed)
[facility]woman follow up   Interpreter: Natale Lay, [employer]  Clinical Measurement:   Vitals:   05/27/20 1026  BP: 132/88      Medical History:  Patient states that she has high cholesterol, does not know if she has high blood pressure and she does not have diabetes.  Medications:  Patient states that she does not take medication to lower cholesterol, blood pressure and blood sugar.  Patient does not take an aspirin a day to help prevent a heart attack or stroke.    Blood pressure, self measurement: Patient states that she does not measure blood pressure from home. She checks her blood pressure N/A. She shares her readings with a health care provider: N/A.   Nutrition: Patient states that on average she eats 2 cups of fruit and 0 cups of vegetables per day. Patient states that she does not eat fish at least 2 times per week. Patient eats less than half servings of whole grains. Patient drinks less than 36 ounces of beverages with added sugar weekly: yes. Patient is currently watching sodium or salt intake: yes. In the past 7 days patient has had 1 drinks containing alcohol. On average patient drinks 2 drinks containing alcohol per day.      Physical activity:  Patient states that she gets 0 minutes of moderate and 0 minutes of vigorous physical activity each week.  Smoking status:  Patient states that she has has never smoked .   Quality of life:  Over the past 2 weeks patient states that she had little interest or pleasure in doing things: several days. She has been feeling down, depressed or hopeless:several days.    Risk reduction and counseling:    Health Coaching: Spoke with patient about adding more vegetables in daily diet. Explained to patient about the importance of whole grains and heart healthy fish as part of a heart healthy diet. Encouraged patient to watch the amount of sweets and sugars consumed in both food and drink as well as carbs. Encouraged patient to continue watching the  amount of salt consumed. Encouraged patient to try and start exercising for at least 20 minutes per day.    Navigation: This was the  follow up session for this patient, I will check up on her progress in the coming months. Will call patient with follow-up appointment information for Internal Medicine for elevated BP once appointment is scheduled. Gave patient Environmental manager for counseling services offered in [place].   Time: 25 minutes

## 2020-06-11 ENCOUNTER — Encounter: Payer: Self-pay | Admitting: Internal Medicine

## 2020-06-11 ENCOUNTER — Other Ambulatory Visit: Payer: Self-pay

## 2020-06-11 ENCOUNTER — Ambulatory Visit (INDEPENDENT_AMBULATORY_CARE_PROVIDER_SITE_OTHER): Payer: Self-pay | Admitting: Internal Medicine

## 2020-06-11 VITALS — BP 114/65 | HR 100 | Temp 98.5°F | Ht 62.0 in | Wt 185.7 lb

## 2020-06-11 DIAGNOSIS — Z599 Problem related to housing and economic circumstances, unspecified: Secondary | ICD-10-CM

## 2020-06-11 DIAGNOSIS — G4730 Sleep apnea, unspecified: Secondary | ICD-10-CM

## 2020-06-11 DIAGNOSIS — R7303 Prediabetes: Secondary | ICD-10-CM

## 2020-06-11 DIAGNOSIS — E782 Mixed hyperlipidemia: Secondary | ICD-10-CM

## 2020-06-11 LAB — POCT GLYCOSYLATED HEMOGLOBIN (HGB A1C): Hemoglobin A1C: 5.6 % (ref 4.0–5.6)

## 2020-06-11 LAB — GLUCOSE, CAPILLARY: Glucose-Capillary: 143 mg/dL — ABNORMAL HIGH (ref 70–99)

## 2020-06-12 DIAGNOSIS — Z599 Problem related to housing and economic circumstances, unspecified: Secondary | ICD-10-CM | POA: Insufficient documentation

## 2020-06-12 DIAGNOSIS — G4730 Sleep apnea, unspecified: Secondary | ICD-10-CM | POA: Insufficient documentation

## 2020-06-12 NOTE — Assessment & Plan Note (Addendum)
She expressed some hesitancy about obtaining certain labs at today's visit. She does not currently have insurance but is interested in obtaining the orange card. We provided the paperwork to get this started at today's visit.

## 2020-06-12 NOTE — Assessment & Plan Note (Signed)
A1C is down to 5.6 today. She notes she has been working on increasing physical activity and has been working on reducing carbohydrates in her diet. Continue current management.

## 2020-06-12 NOTE — Assessment & Plan Note (Signed)
Her children have commented to her that she snores loudly at night and has apnea. She endorses that she wakes up frequently at night gasping for air. On further questioning, she also notes feeling tired when she wakes up in the morning.  STOP BANG score is 4 placing her at high risk of OSA.  We discussed OSA and the importance of diagnosis and treatment. Unfortunately, she does not currently have insurance and does not wish to pay out of pocket for a sleep study at this time. -Recommend sleep study referral upon obtaining insurance coverage.

## 2020-06-12 NOTE — Progress Notes (Signed)
[facility] Office Visit   Patient ID: [name] Hull, female    DOB: 03/26/1980, 40 y.o.   MRN: [id number]   PCP: Jaci Standard, DO   Subjective:  CC: elevated blood pressure reading, prediabetes, hyperlipidemia  [name] Hull is a 40 y.o. year old female who presents for follow up of chronic medical conditions . Please refer to problem based charting for assessment and plan.   ACTIVE MEDICATIONS   Outpatient Medications Prior to Visit  Medication Sig Dispense Refill  . acetaminophen (TYLENOL) 325 MG tablet Take 650 mg by mouth every 6 (six) hours as needed for mild pain.  (Patient not taking: Reported on 05/27/2020)     No facility-administered medications prior to visit.     Objective:   BP 114/65 (BP Location: Left Arm, Patient Position: Sitting, Cuff Size: Normal)   Pulse 100   Temp 98.5 F (36.9 C) (Oral)   Ht 5\' 2"  (1.575 m)   Wt 185 lb 11.2 oz (84.2 kg)   LMP 05/26/2020 (Exact Date)   SpO2 98%   BMI 33.96 kg/m  Wt Readings from Last 3 Encounters:  06/11/20 185 lb 11.2 oz (84.2 kg)  05/27/20 187 lb 6.4 oz (85 kg)  04/11/20 186 lb 12.8 oz (84.7 kg)   BP Readings from Last 3 Encounters:  06/11/20 114/65  05/27/20 130/90  04/11/20 108/74   Physical Exam General: well appearing  Cardiac: RRR, no LE edema Pulm: lungs clear to ausculation  Health Maintenance:   Health Maintenance  Topic Date Due  . COVID-19 Vaccine (1) Never done  . Hepatitis C Screening  Never done  . INFLUENZA VACCINE  08/05/2020  . PAP SMEAR-Modifier  02/20/2022  . TETANUS/TDAP  04/18/2026  . Zoster Vaccines- Shingrix (1 of 2) 04/06/2028  . HIV Screening  Completed  . Pneumococcal Vaccine 56-46 Years old  Aged Out  . HPV VACCINES  Aged Out     Assessment & Plan:   Problem List Items Addressed This Visit      Respiratory   Sleep apnea    Her children have commented to her that she snores loudly at night and has apnea. She endorses that she wakes up frequently at  night gasping for air. On further questioning, she also notes feeling tired when she wakes up in the morning.  STOP BANG score is 4 placing her at high risk of OSA.  We discussed OSA and the importance of diagnosis and treatment. Unfortunately, she does not currently have insurance and does not wish to pay out of pocket for a sleep study at this time. -Recommend sleep study referral upon obtaining insurance coverage.        Other   Hyperlipidemia    10Y ASCVD risk = 1.1% based on 2021 cholesterol panel She denies family history of early cardiac disease.  Discussed the importance of nutrition and physical activity She would like to defer repeat lipid panel at this time. Statin treatment is not indicated at this time.      Prediabetes - Primary    A1C is down to 5.6 today. She notes she has been working on increasing physical activity and has been working on reducing carbohydrates in her diet. Continue current management.       Relevant Orders   POC Hbg A1C (Completed)   Financial barriers    She expressed some hesitancy about obtaining certain labs at today's visit. She does not currently have insurance but is interested in obtaining the  orange card. We provided the paperwork to get this started at today's visit.          Return in about 1 year (around 06/11/2021).   Pt discussed with Dr. Johny Shock, MD Internal Medicine Resident PGY-2 [facility] Pager: [phone] 06/12/2020 5:03 PM

## 2020-06-12 NOTE — Assessment & Plan Note (Signed)
10Y ASCVD risk = 1.1% based on 2021 cholesterol panel She denies family history of early cardiac disease.  Discussed the importance of nutrition and physical activity She would like to defer repeat lipid panel at this time. Statin treatment is not indicated at this time.

## 2020-06-17 NOTE — Progress Notes (Signed)
T

## 2020-06-17 NOTE — Progress Notes (Signed)
Internal Medicine Clinic Attending  Case discussed with Dr. Christian  At the time of the visit.  We reviewed the resident's history and exam and pertinent patient test results.  I agree with the assessment, diagnosis, and plan of care documented in the resident's note.  

## 2020-10-21 ENCOUNTER — Other Ambulatory Visit: Payer: Self-pay

## 2020-10-21 ENCOUNTER — Inpatient Hospital Stay: Payer: Self-pay | Attending: Obstetrics and Gynecology | Admitting: *Deleted

## 2020-10-21 VITALS — BP 120/84 | Ht 64.0 in | Wt 182.1 lb

## 2020-10-21 DIAGNOSIS — Z0000 Encounter for general adult medical examination without abnormal findings: Secondary | ICD-10-CM

## 2020-10-21 NOTE — Progress Notes (Signed)
Wisewoman initial screening   Interpreter- Natale Lay, Haroldine Laws   Clinical Measurement:  Vitals:   10/21/20 0840  BP: 122/86   Fasting Labs Drawn Today, will review with patient when they result.   Medical History:  Patient states that she has high cholesterol,  does not know if she has  high blood pressure and she does not have diabetes.  Medications:  Patient states that she does not take medication to lower cholesterol, blood pressure or blood sugar.  Patient does not take an aspirin a day to help prevent a heart attack or stroke.    Blood pressure, self measurement:    Patient states that she does not measure blood pressure from home. She checks her blood pressure N/A. She shares her readings with a health care provider: N/A.   Nutrition: Patient states that on average she eats 2 cups of fruit and 1 cups of vegetables per day. Patient states that she does not eat fish at least 2 times per week. Patient eats about half servings of whole grains. Patient drinks less than 36 ounces of beverages with added sugar weekly: yes. Patient is currently watching sodium or salt intake: yes. In the past 7 days patient has consumed drinks containing alcohol on 0 days. On a day that patient consumes drinks containing alcohol on average 0 drinks are consumed.      Physical activity:  Patient states that she gets 210 minutes of moderate and 210 minutes of vigorous physical activity each week.  Smoking status:  Patient states that she has has never smoked .   Quality of life:  Over the past 2 weeks patient states that she had little interest or pleasure in doing things: not at all. She has been feeling down, depressed or hopeless:several days.    Risk reduction and counseling: Spoke with patient about the  daily recommendation for fruits and vegetables. Patient is consuming fish but only once weekly. Patient consumes a variety of whole grains including whole wheat bread, oatmeal and whole grain cereals.  Patient has been watching the amount of beverages that she consumes with added sugars as well as her salt intake. Patient has joined [employer] and has been working out 7 days a week for an hour a day. Patient does a variety of low intensity and high intensity cardio workouts. Encouraged patient to continue with exercise routine.   Navigation:  I will notify patient of lab results.  Patient is aware of 2 more health coaching sessions and a follow up. Gave patient informational brochure for [employer] of the [place].  Time: 25 minutes

## 2020-10-22 LAB — GLUCOSE, RANDOM: Glucose: 103 mg/dL — ABNORMAL HIGH (ref 70–99)

## 2020-10-22 LAB — LIPID PANEL
Chol/HDL Ratio: 4.6 ratio — ABNORMAL HIGH (ref 0.0–4.4)
Cholesterol, Total: 207 mg/dL — ABNORMAL HIGH (ref 100–199)
HDL: 45 mg/dL (ref >39)
LDL Chol Calc (NIH): 130 mg/dL — ABNORMAL HIGH (ref 0–99)
Triglycerides: 180 mg/dL — ABNORMAL HIGH (ref 0–149)
VLDL Cholesterol Cal: 32 mg/dL (ref 5–40)

## 2020-10-22 LAB — HEMOGLOBIN A1C
Est. average glucose Bld gHb Est-mCnc: 126 mg/dL
Hgb A1c MFr Bld: 6 % — ABNORMAL HIGH (ref 4.8–5.6)

## 2020-10-28 ENCOUNTER — Telehealth: Payer: Self-pay

## 2020-10-28 NOTE — Telephone Encounter (Signed)
Left message for patient about lab results from [facility]. Left name and number for patient to call back.   

## 2020-10-28 NOTE — Telephone Encounter (Signed)
Health coaching 2   interpreter- Natale Lay, [employer]   Labs- 207 cholesterol, 130 LDL cholesterol, 180 triglycerides, 45 HDL cholesterol, 6.0 hemoglobin A1C, 103 mean plasma glucose. Patient understands and is aware of her lab results.   Goals-  1. Reduce the amount of fried and fatty foods consumed. Try to grill, bake, broil or sautee foods instead. 2. Reduce the amount of red meat consumed. Substitute for lean protein like chicken, fish or [place]. 3. Reduce the amount of whole-fat dairy products consumed. Look for low-fat or reduced-fat options instead. 4. Continue watching the amount of sweets and sugars consumed in both food and drink.  5. Cut back on the amount of carbs consumed.   Patient has good exercise routine established. Encouraged her to continue with current program.   Navigation:  Patient is aware of 1 more health coaching sessions and a follow up. Will call patient back with follow-up appointment information once appointment has been scheduled with Internal Medicine for elevated labs. Referred patient to [facility] program.   Time-  10 minutes

## 2020-11-08 ENCOUNTER — Other Ambulatory Visit: Payer: Self-pay

## 2020-11-08 ENCOUNTER — Ambulatory Visit (INDEPENDENT_AMBULATORY_CARE_PROVIDER_SITE_OTHER): Payer: Self-pay | Admitting: Internal Medicine

## 2020-11-08 ENCOUNTER — Encounter: Payer: Self-pay | Admitting: Internal Medicine

## 2020-11-08 DIAGNOSIS — E782 Mixed hyperlipidemia: Secondary | ICD-10-CM

## 2020-11-08 DIAGNOSIS — R7303 Prediabetes: Secondary | ICD-10-CM

## 2020-11-08 NOTE — Assessment & Plan Note (Signed)
A1c of 6.0 today. Discussed that patient is still in the prediabetic range and emphasized the importance of a health diet, regular exercise, and weight loss. Patient expresses understanding of this information. Continue current management. Pt given Diabetes handout and instructed to read sections on diet and exercise.

## 2020-11-08 NOTE — Patient Instructions (Signed)
Thank you, [name] Hull for allowing [place] to provide your care today.   Hoy discutimos tus niveles de cholesterol y tu A1c. Tienes pre-diabetes pero no tienes diabetes. Te damos un libro de diabetes. Por favor lee las secciones de dieta y ejercicio.   Regresa en un ano.  I have ordered the following labs for you:  Lab Orders  No laboratory test(s) ordered today     Tests ordered today:  Nada  Referrals ordered today:   Referral Orders  No referral(s) requested today     I have ordered the following medication/changed the following medications:   Stop the following medications: There are no discontinued medications.   Start the following medications: No orders of the defined types were placed in this encounter.    Follow up: 1 year    Should you have any questions or concerns please call the internal medicine clinic at [phone].

## 2020-11-08 NOTE — Progress Notes (Signed)
   CC: Well woman visit  HPI:  [name] is a 40 y.o. who is here today for well woman visit to review recent labs. Please see problem-based list for further details, assessments, and plans.  Past Medical History:  Diagnosis Date   Anxiety    Depression    Foot fracture, left    as child   Gestational diabetes    History of eclampsia 02/17/2016   Base line PIH labs ordered today 02/17/2016: neg Taking ASA   Hypertension    Migraines    Pregnancy induced hypertension    Seizure disorder during pregnancy (HCC) 2012   Review of Systems:  Review of Systems  Constitutional: Negative.   HENT: Negative.    Eyes: Negative.   Respiratory: Negative.    Cardiovascular: Negative.   Gastrointestinal: Negative.   Genitourinary: Negative.   Musculoskeletal: Negative.   Skin: Negative.   Neurological: Negative.   Endo/Heme/Allergies: Negative.     Physical Exam:  Vitals:   11/08/20 0916  BP: 123/69  Pulse: 92  Temp: 98 F (36.7 C)  TempSrc: Oral  SpO2: 100%  Weight: 182 lb 14.4 oz (83 kg)  Height: 5\' 3"  (1.6 m)   Physical Exam Constitutional:      General: She is not in acute distress. HENT:     Head: Normocephalic and atraumatic.  Eyes:     Extraocular Movements: Extraocular movements intact.     Pupils: Pupils are equal, round, and reactive to light.  Cardiovascular:     Rate and Rhythm: Normal rate and regular rhythm.     Heart sounds: No murmur heard.   No friction rub. No gallop.  Pulmonary:     Effort: Pulmonary effort is normal.     Breath sounds: Normal breath sounds. No wheezing, rhonchi or rales.  Abdominal:     General: Abdomen is flat. There is no distension.     Palpations: Abdomen is soft.     Tenderness: There is no abdominal tenderness.  Musculoskeletal:        General: No swelling, tenderness or deformity.     Cervical back: Neck supple.  Skin:    General: Skin is warm and dry.  Neurological:     General: No focal deficit  present.     Mental Status: She is alert and oriented to person, place, and time. Mental status is at baseline.  Psychiatric:        Mood and Affect: Mood normal.        Behavior: Behavior normal.     Assessment & Plan:   See Encounters Tab for problem based charting.  Patient seen with Dr. 

## 2020-11-08 NOTE — Assessment & Plan Note (Signed)
10 Year ASCVD risk = 0.9%. Reviewed recent labs with the patient today. Discussed our recommendation at this time for healthy diet, regular exercise, and weight loss. Statin treatment is not indicated at this time.

## 2020-11-15 NOTE — Progress Notes (Signed)
Internal Medicine Clinic Attending ? ?I saw and evaluated the patient.  I personally confirmed the key portions of the history and exam documented by Dr. Bonanno and I reviewed pertinent patient test results.  The assessment, diagnosis, and plan were formulated together and I agree with the documentation in the resident?s note. ? ?

## 2021-03-10 ENCOUNTER — Other Ambulatory Visit: Payer: Self-pay

## 2021-03-10 DIAGNOSIS — Z1231 Encounter for screening mammogram for malignant neoplasm of breast: Secondary | ICD-10-CM

## 2021-04-24 ENCOUNTER — Ambulatory Visit: Payer: No Typology Code available for payment source

## 2021-06-05 ENCOUNTER — Telehealth: Payer: Self-pay

## 2021-06-05 NOTE — Telephone Encounter (Signed)
Left message for patient via Erika McReynolds, [employer] about completing HC 3 for the [facility] program. Left name and number for patient to call back.  °

## 2021-10-29 ENCOUNTER — Other Ambulatory Visit: Payer: Self-pay

## 2021-10-29 DIAGNOSIS — Z1231 Encounter for screening mammogram for malignant neoplasm of breast: Secondary | ICD-10-CM

## 2021-12-18 ENCOUNTER — Ambulatory Visit: Payer: No Typology Code available for payment source

## 2022-01-22 ENCOUNTER — Ambulatory Visit: Payer: No Typology Code available for payment source

## 2022-01-22 ENCOUNTER — Telehealth: Payer: Self-pay

## 2022-01-22 NOTE — Telephone Encounter (Signed)
Telephoned patient at mobile number. Left a voice message with BCCCP contact information. 

## 2022-02-24 ENCOUNTER — Ambulatory Visit: Payer: Self-pay | Admitting: *Deleted

## 2022-02-24 ENCOUNTER — Ambulatory Visit
Admission: RE | Admit: 2022-02-24 | Discharge: 2022-02-24 | Disposition: A | Discharge status: Home / Self Care | Payer: No Typology Code available for payment source | Source: Ambulatory Visit | Attending: Obstetrics and Gynecology | Admitting: Obstetrics and Gynecology

## 2022-02-24 VITALS — BP 130/62 | Wt 186.0 lb

## 2022-02-24 DIAGNOSIS — Z1231 Encounter for screening mammogram for malignant neoplasm of breast: Secondary | ICD-10-CM

## 2022-02-24 DIAGNOSIS — Z1239 Encounter for other screening for malignant neoplasm of breast: Secondary | ICD-10-CM

## 2022-02-24 NOTE — Progress Notes (Addendum)
[name] Hull is a 42 y.o. female who presents to [facility] clinic today with complaint of left lower breast pain prior to menstrual period last month. Patient denied any pain today.    Pap Smear: Pap smear not completed today. Last Pap smear was 02/21/2019 at [facility] clinic and was normal with negative HPV. Per patient has history of an abnormal Pap smear in 2009 that a repeat Pap smear was completed for follow-up. Per patient all Pap smears have been normal and has had at least three normal Pap smears since. Last Pap smear result is available in Epic.    Physical exam: Breasts Breasts symmetrical. No skin abnormalities bilateral breasts. No nipple retraction bilateral breasts. No nipple discharge bilateral breasts. No lymphadenopathy. No lumps palpated bilateral breasts. No complaints of pain or tenderness on exam.      MS DIGITAL SCREENING TOMO BILATERAL  Result Date: 04/12/2020 CLINICAL DATA:  Screening. EXAM: DIGITAL SCREENING BILATERAL MAMMOGRAM WITH TOMOSYNTHESIS AND CAD TECHNIQUE: Bilateral screening digital craniocaudal and mediolateral oblique mammograms were obtained. Bilateral screening digital breast tomosynthesis was performed. The images were evaluated with computer-aided detection. COMPARISON:  Previous exam(s). ACR Breast Density Category b: There are scattered areas of fibroglandular density. FINDINGS: There are no findings suspicious for malignancy. The images were evaluated with computer-aided detection. IMPRESSION: No mammographic evidence of malignancy. A result letter of this screening mammogram will be mailed directly to the patient. RECOMMENDATION: Screening mammogram in one year. (Code:SM-B-01Y) BI-RADS CATEGORY  1: Negative. Electronically Signed   By: Claudie Revering M.D.   On: 04/12/2020 15:12   MS DIGITAL DIAG TOMO BILAT  Result Date: 02/21/2019 CLINICAL DATA:  42 year old presenting with RIGHT nipple pain which began approximately 2-3 weeks ago, which the patient states  has recently resolved. This is the patient's initial baseline mammogram. EXAM: DIGITAL DIAGNOSTIC BILATERAL MAMMOGRAM WITH CAD AND TOMO ULTRASOUND RIGHT BREAST COMPARISON:  None. ACR Breast Density Category b: There are scattered areas of fibroglandular density. FINDINGS: Tomosynthesis and synthesized full field CC and MLO views of both breasts were obtained. Tomosynthesis and synthesized spot compression view of the subareolar location of the RIGHT breast was also obtained. No findings suspicious for malignancy in either breast. Specifically, no mammographic abnormality in the subareolar RIGHT breast to explain the patient's prior nipple pain. Mammographic images were processed with CAD. Targeted RIGHT breast ultrasound is performed, showing normal fibroglandular tissue in the subareolar location. No cyst, solid mass, duct ectasia or intraductal mass is identified. IMPRESSION: 1. No mammographic or sonographic evidence of malignancy involving the RIGHT breast. 2. No mammographic evidence of malignancy involving the LEFT breast. RECOMMENDATION: Screening mammogram in one year.(Code:SM-B-01Y) I have discussed the findings and recommendations with the patient. Communication with the patient was achieved with the assistance of a certified interpreter. If applicable, a reminder letter will be sent to the patient regarding the next appointment. BI-RADS CATEGORY  1: Negative. Electronically Signed   By: Evangeline Dakin M.D.   On: 02/21/2019 14:20     Pelvic/Bimanual Pap is not indicated today per BCCCP guidelines.   Smoking History: Patient has never smoked.   Patient Navigation: Patient education provided. Access to services provided for patient through [place] program. Spanish interpreter Rudene Anda from [facility] provided. Patient has food insecurities. Patient escorted to the market at the [facility] for groceries.   Breast and Cervical Cancer Risk Assessment: Patient does not have  family history of breast cancer, known genetic mutations, or radiation treatment to the chest before  age 5. Patient does not have history of cervical dysplasia, immunocompromised, or DES exposure in-utero.  Risk Scores as of 02/24/2022     [name]           5-year 0.7 %   Lifetime 9.61 %   This patient is Hisp[place]/Latina but has no documented birth country, so the [place] used data from [place] patients to calculate their risk score. Document a birth country in the Demographics activity for a more accurate score.         Last calculated by Claretha Cooper, CMA on 02/24/2022 at 10:14 AM        A: BCCCP exam without pap smear No complaints.  P: Referred patient to the [facility] for a screening mammogram on mobile unit. Appointment scheduled Tuesday, February 24, 2022 at 1030.  Loletta Parish, RN 02/24/2022 10:20 AM

## 2022-02-24 NOTE — Patient Instructions (Signed)
Explained breast self awareness with Leandro Reasoner. Patient did not need a Pap smear today due to last Pap smear and HPV typing was 02/21/2019. Let her know BCCCP will cover Pap smears and HPV typing every 5 years unless has a history of abnormal Pap smears. Referred patient to the [facility] for a screening mammogram on mobile unit. Appointment scheduled Tuesday, February 24, 2022 at 1030. Patient aware of appointment and will be there. Let patient know the [facility] will follow up with her within the next couple weeks with results of her mammogram by letter or phone. Leandro Reasoner verbalized understanding.  Clytie Shetley, Arvil Chaco, RN 10:20 AM

## 2022-03-27 ENCOUNTER — Other Ambulatory Visit (HOSPITAL_COMMUNITY): Payer: Self-pay | Admitting: Family Medicine

## 2022-03-27 DIAGNOSIS — G43109 Migraine with aura, not intractable, without status migrainosus: Secondary | ICD-10-CM

## 2022-05-06 ENCOUNTER — Other Ambulatory Visit: Payer: Self-pay

## 2022-05-06 ENCOUNTER — Inpatient Hospital Stay: Payer: No Typology Code available for payment source | Attending: Obstetrics and Gynecology | Admitting: *Deleted

## 2022-05-06 VITALS — BP 122/88 | Ht 64.0 in | Wt 187.3 lb

## 2022-05-06 DIAGNOSIS — Z0000 Encounter for general adult medical examination without abnormal findings: Secondary | ICD-10-CM

## 2022-05-06 NOTE — Progress Notes (Signed)
[facility] initial screening   Vito Backers, [place] [employer] #[id number]   Clinical Measurement:  Vitals:   05/06/22 0816 05/06/22 0845  BP: (!) 122/90 122/88   Fasting Labs Drawn Today, will review with patient when they result.   Medical History: Patient states that she has high cholesterol, does not have high blood pressure and she does not have diabetes.  Medications: Patient states that she does not take medication to lower cholesterol, blood pressure or blood sugar.  Patient does not take an aspirin a day to help prevent a heart attack or stroke.    Blood pressure, self measurement: Patient states that she does measure blood pressure from home. She checks her blood pressure weekly. She shares her readings with a health care provider: no.   Nutrition: Patient states that on average she eats 2 cups of fruit and 0 cups of vegetables per day. Patient states that she does not eat fish at least 2 times per week. Patient eats less than half servings of whole grains. Patient drinks less than 36 ounces of beverages with added sugar weekly: yes. Patient is currently watching sodium or salt intake: yes. In the past 7 days patient has consumed drinks containing alcohol on 0 days. On a day that patient consumes drinks containing alcohol on average 0 drinks are consumed.      Physical activity: Patient states that she gets 420 minutes of moderate and 0 minutes of vigorous physical activity each week.  Smoking status: Patient states that she has has never smoked .   Quality of life: Over the past 2 weeks patient states that she had little interest or pleasure in doing things: several days. She has been feeling down, depressed or hopeless:not at all.   Social Determinants of Health Assessment:   Computer Use: During the last 12 months patient states that she has used any of the following: desktop/laptop, smart phone or tablet/other portable wireless computer: yes.   Internet Use: During the  last 12 months, did you or any member of your household have access to the internet: Yes, by paying a cell phone company or internet service provider.   Food Insecurities: During the last 12 months, where there any times when you were worried that you would run out of food because of a lack of money or other resources: No.   Transportation Barriers: During the last 12 months, have you missed a doctor's appointment because of transportation problems: No.   Childcare Barriers: If you are currently using childcare services, please identify  the type of services you use. (If not using childcare services, please select "Not applicable"): not applicable. During the last 12 months, have you had any barriers to childcare services such as: cost, availability, transportation and hours of operation.   Housing: What is your housing situation today: I have housing, but I am worried about losing my housing.   Intimate Partner Violence: During the last 12 months, how often did your partner physically hurt you:  NA . During the last 12 months, how often did your partner insult you or talk down to you:  NA .  Medication Adherence: During the last 12 months, did you ever forget to take your medicine: No. During the last 12 months, were you careless ar times about taking your medicine: No. During the last 12 months, when you felt better did you sometimes stop taking your medication: Yes. During the last 12 months, sometimes if you felt worse when you took your medicine did you  stop taking it: No.   Risk reduction and counseling:   Health Coaching: Spoke with patient about the daily recommendation for fruits and vegetables. Showed patient what a serving size would look like. Patient consumes one serving of fish weekly. Spoke with patient about adding in an extra serving of fish in her weekly diet. Due to elevated cholesterol levels in the past spoke with patient about eating more lean proteins like chicken and fish and  less red meat. Patient consumes whole wheat bread and cereal but not regularly. Explained to patient how whole grains can reduce cholesterol levels. Patient consumes 1-2 cups of sweetened beverages weekly. Patient does watch her salt intake. Patient states that she does check her BP from home and that her readings are sometimes "high". Suggested to patient that she start checking and tracking her BP in the morning when she first wakes up over the next week. Encouraged patient to take readings with her to FU appointment with PCP. Patient states that she has been walking for an hour daily. Encouraged patient to continue with daily exercise. Patient states that she does need to cut back on the amount of fast food and "junk food" that she consumes. Gave patient a spanish heart healthy cook book to take home with her to help with healthy eating choices. Suggested to patient that she look for healthy snacks to have in her home to help with avoiding junk food items.   Goal: Patient would like to loose 5 pounds over the next 3 months. Patient would like to loose weight to improve her overall health. Patient would like to make healthier food choices to loose weight. Patient would like to increase her fruit and vegetable intake and eat less fast food and junk foods.    Navigation:  I will notify patient of lab results.  Patient is aware of 2 more health coaching sessions and a follow up.  Time: 25 minutes

## 2022-05-07 LAB — LIPID PANEL
Chol/HDL Ratio: 4.3 ratio (ref 0.0–4.4)
Cholesterol, Total: 202 mg/dL — ABNORMAL HIGH (ref 100–199)
HDL: 47 mg/dL (ref >39)
LDL Chol Calc (NIH): 118 mg/dL — ABNORMAL HIGH (ref 0–99)
Triglycerides: 214 mg/dL — ABNORMAL HIGH (ref 0–149)
VLDL Cholesterol Cal: 37 mg/dL (ref 5–40)

## 2022-05-07 LAB — GLUCOSE, RANDOM: Glucose: 82 mg/dL (ref 70–99)

## 2022-05-13 ENCOUNTER — Telehealth: Payer: Self-pay

## 2022-05-13 NOTE — Telephone Encounter (Signed)
Health coaching 2   interpreter- Natale Lay, [employer]   Labs- 202 cholesterol, 118 LDL cholesterol, 214 triglycerides, 47 HDL cholesterol, mean plasma glucose. Patient understands and is aware of her lab results.   Goals- 1. Reduce the amount of fried and fatty foods consumed. Try to grill, bake, broil or sautee foods instead.  2. Reduce the amount of red meat consumed. Substitute for lean protein like chicken or fish. 3. Increase whole grain intake. 4. Increase fruit and vegetable intake. 5. Daily exercise for 20-30 minutes   Navigation:  Patient is aware of 1 more health coaching sessions and a follow up. Will refer patient to Internal Medicine for FU for elevated labs.  Time- 10 minutes

## 2022-06-08 ENCOUNTER — Ambulatory Visit: Payer: No Typology Code available for payment source

## 2022-09-02 ENCOUNTER — Emergency Department (HOSPITAL_COMMUNITY)
Admission: EM | Admit: 2022-09-02 | Discharge: 2022-09-02 | Disposition: A | Discharge status: Home / Self Care | Payer: No Typology Code available for payment source | Attending: Emergency Medicine | Admitting: Emergency Medicine

## 2022-09-02 ENCOUNTER — Encounter (HOSPITAL_COMMUNITY): Payer: Self-pay

## 2022-09-02 ENCOUNTER — Emergency Department (HOSPITAL_COMMUNITY): Payer: No Typology Code available for payment source

## 2022-09-02 DIAGNOSIS — R42 Dizziness and giddiness: Secondary | ICD-10-CM | POA: Insufficient documentation

## 2022-09-02 DIAGNOSIS — I10 Essential (primary) hypertension: Secondary | ICD-10-CM | POA: Insufficient documentation

## 2022-09-02 DIAGNOSIS — R0789 Other chest pain: Secondary | ICD-10-CM | POA: Insufficient documentation

## 2022-09-02 DIAGNOSIS — R0602 Shortness of breath: Secondary | ICD-10-CM | POA: Insufficient documentation

## 2022-09-02 LAB — CBC WITH DIFFERENTIAL/PLATELET
Abs Immature Granulocytes: 0.02 10*3/uL (ref 0.00–0.07)
Basophils Absolute: 0.1 10*3/uL (ref 0.0–0.1)
Basophils Relative: 1 %
Eosinophils Absolute: 0.2 10*3/uL (ref 0.0–0.5)
Eosinophils Relative: 3 %
HCT: 38.3 % (ref 36.0–46.0)
Hemoglobin: 13.4 g/dL (ref 12.0–15.0)
Immature Granulocytes: 0 %
Lymphocytes Relative: 48 %
Lymphs Abs: 4 10*3/uL (ref 0.7–4.0)
MCH: 32.8 pg (ref 26.0–34.0)
MCHC: 35 g/dL (ref 30.0–36.0)
MCV: 93.6 fL (ref 80.0–100.0)
Monocytes Absolute: 0.6 10*3/uL (ref 0.1–1.0)
Monocytes Relative: 8 %
Neutro Abs: 3.3 10*3/uL (ref 1.7–7.7)
Neutrophils Relative %: 40 %
Platelets: 287 10*3/uL (ref 150–400)
RBC: 4.09 MIL/uL (ref 3.87–5.11)
RDW: 13.1 % (ref 11.5–15.5)
WBC: 8.2 10*3/uL (ref 4.0–10.5)
nRBC: 0 % (ref 0.0–0.2)

## 2022-09-02 LAB — COMPREHENSIVE METABOLIC PANEL
ALT: 26 U/L (ref 0–44)
AST: 24 U/L (ref 15–41)
Albumin: 4.1 g/dL (ref 3.5–5.0)
Alkaline Phosphatase: 39 U/L (ref 38–126)
Anion gap: 13 (ref 5–15)
BUN: 14 mg/dL (ref 6–20)
CO2: 22 mmol/L (ref 22–32)
Calcium: 9.5 mg/dL (ref 8.9–10.3)
Chloride: 101 mmol/L (ref 98–111)
Creatinine, Ser: 0.78 mg/dL (ref 0.44–1.00)
GFR, Estimated: >60 mL/min (ref >60)
Glucose, Bld: 112 mg/dL — ABNORMAL HIGH (ref 70–99)
Potassium: 3.5 mmol/L (ref 3.5–5.1)
Sodium: 136 mmol/L (ref 135–145)
Total Bilirubin: 0.3 mg/dL (ref 0.3–1.2)
Total Protein: 7.8 g/dL (ref 6.5–8.1)

## 2022-09-02 LAB — D-DIMER, QUANTITATIVE: D-Dimer, Quant: <0.27 ug{FEU}/mL (ref 0.00–0.50)

## 2022-09-02 LAB — TROPONIN I (HIGH SENSITIVITY)
Troponin I (High Sensitivity): 3 ng/L (ref <18)
Troponin I (High Sensitivity): 3 ng/L (ref <18)

## 2022-09-02 NOTE — ED Notes (Signed)
Patient transported to X-ray 

## 2022-09-02 NOTE — ED Notes (Signed)
Discharge instructions discussed with pt. Verbalized understanding. VSS. No questions or concerns regarding discharge  

## 2022-09-02 NOTE — ED Provider Triage Note (Signed)
Emergency Medicine Provider Triage Evaluation Note  [name] Hull , a 42 y.o. female  was evaluated in triage.  Pt complains of chest pain. 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 Acknowledges history of anxiety and not currently taking anything for anxiety. Denies recent substance use. States chest pain is central in nature but denies nausea, vomiting, or diaphoresis. No shortness of breath.  Review of Systems  Positive: As above Negative: As above  Physical Exam  BP 136/61   Pulse (!) 112   Temp 98.4 F (36.9 C)   Resp 17   SpO2 100%  Gen:   Awake, no distress   Resp:  Normal effort  MSK:   Moves extremities without difficulty  Other:  Somewhat lethargic but answering all questions. Pupils are PERRL.  Medical Decision Making  Medically screening exam initiated at 3:18 PM.  Appropriate orders placed.  Amer Jaynes was informed that the remainder of the evaluation will be completed by another provider, this initial triage assessment does not replace that evaluation, and the importance of remaining in the ED until their evaluation is complete.     Smitty Knudsen, PA-C 09/02/22 [phone]

## 2022-09-02 NOTE — Discharge Instructions (Signed)
Evaluamos su dolor en el pecho. No detectamos ninguna causa peligrosa de dolor en el pecho, como un ataque cardaco o un cogulo de [place]. Tome Tylenol y Motrin para sus sntomas en casa. Puede tomar 1000 mg de Tylenol cada 6 horas y 600 mg de ibuprofeno cada 6 horas segn sea necesario para sus sntomas. Puede tomar estos medicamentos juntos segn sea necesario, ya sea al mismo tiempo o alternndolos cada 3 horas. Realice un seguimiento con su mdico de cabecera.

## 2022-09-02 NOTE — ED Triage Notes (Signed)
Pt BIB [facility] with c/o chest pain. 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 Recently  seen by PCP for high bp, doesn't take medications for high bp.   158/114 HR 108 98% room air  CBG 129

## 2022-09-02 NOTE — ED Provider Notes (Signed)
[place] EMERGENCY DEPARTMENT AT [facility] Provider Note  CSN: [id number] Arrival date & time: 09/02/22 1458  Chief Complaint(s) Chest Pain and Anxiety  HPI [name] Hull is a 42 y.o. female with history of anxiety and depression presenting to the emergency department for chest pain.  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  Reports it is pleuritic.  Reports some shortness of breath.  Pain does not radiate.  Reports mild lightheadedness.  No abdominal pain, nausea, vomiting, diarrhea.  No recent travel or surgeries.  Denies similar episode in the past.  Spanish interpreter used Past Medical History Past Medical History:  Diagnosis Date  . Anxiety   . Depression   . Foot fracture, left    as child  . Gestational diabetes   . History of eclampsia 02/17/2016   Base line PIH labs ordered today 02/17/2016: neg Taking ASA  . Hypertension   . Migraines   . Pregnancy induced hypertension   . Seizure disorder during pregnancy [facility]) 2012   Patient Active Problem List   Diagnosis Date Noted  . Financial barriers 06/12/2020  . Sleep apnea 06/12/2020  . Hyperlipidemia 10/18/2019  . Migraine 10/18/2019  . Prediabetes 10/18/2019  . Language barrier 04/30/2016   Home Medication(s) Prior to Admission medications   Medication Sig Start Date End Date Taking? Authorizing Provider  acetaminophen (TYLENOL) 325 MG tablet Take 650 mg by mouth every 6 (six) hours as needed for mild pain. Patient not taking: Reported on 05/06/2022    [provider]  omeprazole (PRILOSEC) 40 MG capsule Take 1 capsule (40 mg total) by mouth daily. 03/23/18 07/20/19  Ward, Layla Maw, DO  sertraline (ZOLOFT) 50 MG tablet Take 1 tablet (50 mg total) by mouth daily. 06/17/16 07/20/19  Marylene Land, CNM                                                                                                                                    Past Surgical  History Past Surgical History:  Procedure Laterality Date  . CESAREAN SECTION N/A 06/14/2016   Procedure: CESAREAN SECTION;  Surgeon: Lesly Dukes, MD;  Location: [facility] BIRTHING SUITES;  Service: Obstetrics;  Laterality: N/A;  . NO PAST SURGERIES    . TUBAL LIGATION     Family History Family History  Problem Relation Age of Onset  . Hypertension Mother   . Diabetes Mother   . Diabetes Sister   . Hypertension Sister   . Breast cancer Neg Hx     Social History Social History   Tobacco Use  . Smoking status: Never  . Smokeless tobacco: Never  Vaping Use  . Vaping status: Never Used  Substance Use Topics  . Alcohol use: No  . Drug use: No   Allergies Patient has no known allergies.  Review of Systems Review of Systems  All other systems reviewed and are negative.  Physical Exam Vital Signs  I have reviewed the triage vital signs BP (!) 113/58   Pulse 86   Temp 98 F (36.7 C) (Oral)   Resp 14   SpO2 100%  Physical Exam Vitals and nursing note reviewed.  Constitutional:      General: She is not in acute distress.    Appearance: She is well-developed.  HENT:     Head: Normocephalic and atraumatic.     Mouth/Throat:     Mouth: Mucous membranes are moist.  Eyes:     Pupils: Pupils are equal, round, and reactive to light.  Cardiovascular:     Rate and Rhythm: Normal rate and regular rhythm.     Heart sounds: No murmur heard. Pulmonary:     Effort: Pulmonary effort is normal. No respiratory distress.     Breath sounds: Normal breath sounds.  Chest:     Chest wall: Tenderness present.  Abdominal:     General: Abdomen is flat.     Palpations: Abdomen is soft.     Tenderness: There is no abdominal tenderness.  Musculoskeletal:        General: No tenderness.     Right lower leg: No edema.     Left lower leg: No edema.  Skin:    General: Skin is warm and dry.  Neurological:     General: No focal deficit present.     Mental Status: She is alert. Mental  status is at baseline.  Psychiatric:        Mood and Affect: Mood is anxious.        Behavior: Behavior normal.    ED Results and Treatments Labs (all labs ordered are listed, but only abnormal results are displayed) Labs Reviewed  COMPREHENSIVE METABOLIC PANEL - Abnormal; Notable for the following components:      Result Value   Glucose, Bld 112 (*)    All other components within normal limits  CBC WITH DIFFERENTIAL/PLATELET  D-DIMER, QUANTITATIVE  RAPID URINE DRUG SCREEN, HOSP PERFORMED  HCG, SERUM, QUALITATIVE  TROPONIN I (HIGH SENSITIVITY)  TROPONIN I (HIGH SENSITIVITY)                                                                                                                          Radiology DG Chest 2 View  Result Date: 09/02/2022 CLINICAL DATA:  Chest pain EXAM: CHEST - 2 VIEW COMPARISON:  None Available. FINDINGS: The heart size and mediastinal contours are within normal limits. Both lungs are clear. The visualized skeletal structures are unremarkable. IMPRESSION: No active cardiopulmonary disease. Electronically Signed   By: Darliss Cheney M.D.   On: 09/02/2022 17:28    Pertinent labs & imaging results that were available during my care of the patient were reviewed by me and considered in my medical decision making (see MDM for details).  Medications Ordered in ED Medications - No data to display  Procedures Procedures  (including critical care time)  Medical Decision Making / ED Course   MDM:  42 year old female presenting to the emergency department with chest pain.  Patient overall well-appearing, physical exam initially with mild tachycardia which resolved on recheck.  Patient appears anxious and does have some reproducible tenderness.  By history very low concern for ACS.  Will check EKG.  Will check troponin.  Doubt  pulmonary embolism but will check D-dimer.  Doubt dissection, will obtain chest x-ray, 2+ radial pulses bilaterally.  Will check chest x-ray to evaluate for pneumonia or pneumothorax but lungs clear and equal.  Doubt esophageal pathology with no nausea or vomiting.  Seems most likely musculoskeletal or anxiety related as patient appears anxious.  Clinical Course as of 09/02/22 2009  Wed Sep 02, 2022  2009 Labs reassuring including delta troponin, D-dimer is negative.  Very low concern for dangerous process. Will discharge patient to home. All questions answered. Patient comfortable with plan of discharge. Return precautions discussed with patient and specified on the after visit summary.  [WS]    Clinical Course User Index [WS] Lonell Grandchild, MD     Additional history obtained: -Additional history obtained from ems -External records from outside source obtained and reviewed including: Chart review including previous notes, labs, imaging, consultation notes including prior PMD notes   Lab Tests: -I ordered, reviewed, and interpreted labs.   The pertinent results include:   Labs Reviewed  COMPREHENSIVE METABOLIC PANEL - Abnormal; Notable for the following components:      Result Value   Glucose, Bld 112 (*)    All other components within normal limits  CBC WITH DIFFERENTIAL/PLATELET  D-DIMER, QUANTITATIVE  RAPID URINE DRUG SCREEN, HOSP PERFORMED  HCG, SERUM, QUALITATIVE  TROPONIN I (HIGH SENSITIVITY)  TROPONIN I (HIGH SENSITIVITY)    Notable for reassuring results   EKG   EKG Interpretation Date/Time:  Wednesday September 02 2022 19:06:14 EDT Ventricular Rate:  98 PR Interval:  190 QRS Duration:  82 QT Interval:  370 QTC Calculation: 472 R Axis:   27  Text Interpretation: Normal sinus rhythm Normal ECG When compared with ECG of 02-Sep-2022 18:09,  limb lead reversal is fixed Confirmed by Alvino Blood ([id number]) on 09/02/2022 7:28:30 PM         Imaging Studies  ordered: I ordered imaging studies including *** On my interpretation imaging demonstrates *** I independently visualized and interpreted imaging. I agree with the radiologist interpretation   Medicines ordered and prescription drug management: No orders of the defined types were placed in this encounter.   -I have reviewed the patients home medicines and have made adjustments as needed   Consultations Obtained: I requested consultation with the ***,  and discussed lab and imaging findings as well as pertinent plan - they recommend: ***   Cardiac Monitoring: The patient was maintained on a cardiac monitor.  I personally viewed and interpreted the cardiac monitored which showed an underlying rhythm of: ***  Social Determinants of Health:  Diagnosis or treatment significantly limited by social determinants of health: {wssoc:28071}   Reevaluation: After the interventions noted above, I reevaluated the patient and found that their symptoms have {resolved/improved/worsened:23923::"improved"}  Co morbidities that complicate the patient evaluation . Past Medical History:  Diagnosis Date  . Anxiety   . Depression   . Foot fracture, left    as child  . Gestational diabetes   . History of eclampsia 02/17/2016   Base line PIH labs ordered today 02/17/2016: neg Taking  ASA  . Hypertension   . Migraines   . Pregnancy induced hypertension   . Seizure disorder during pregnancy [facility]) 2012      Dispostion: Disposition decision including need for hospitalization was considered, and patient {wsdispo:28070::"discharged from emergency department."}    Final Clinical Impression(s) / ED Diagnoses Final diagnoses:  Atypical chest pain     This chart was dictated using voice recognition software.  Despite best efforts to proofread,  errors can occur which can change the documentation meaning.

## 2023-01-21 ENCOUNTER — Telehealth: Payer: Self-pay

## 2023-01-21 NOTE — Telephone Encounter (Signed)
Health Coaching 3  interpreter- Natale Lay, [facility]   Goals- Patient has been trying to loose weight over the past several months. Patient has been trying to make healthier food choices to help with weight loss. She has been trying to eat healthier foods like fruits and vegetables. Patient will continue to try and eat less junk foods.   New goal-   Barrier to reaching goal-    Strategies to overcome-    Navigation:  Patient is aware of  a follow up session. FU appt scheduled 03/01/23.   Time- 10 minutes

## 2023-03-01 ENCOUNTER — Inpatient Hospital Stay: Payer: No Typology Code available for payment source | Attending: Obstetrics and Gynecology | Admitting: *Deleted

## 2023-03-01 ENCOUNTER — Other Ambulatory Visit: Payer: Self-pay

## 2023-03-01 VITALS — BP 142/98 | Ht 64.0 in | Wt 170.1 lb

## 2023-03-01 DIAGNOSIS — Z1231 Encounter for screening mammogram for malignant neoplasm of breast: Secondary | ICD-10-CM

## 2023-03-01 DIAGNOSIS — Z0000 Encounter for general adult medical examination without abnormal findings: Secondary | ICD-10-CM

## 2023-03-01 NOTE — Progress Notes (Signed)
 Wisewoman follow up   Interpreter: Natale Lay, Haroldine Laws  Clinical Measurement:   Vitals:   03/01/23 1257 03/01/23 1311  BP: (!) 138/98 (!) 142/98      Medical History: Patient states that she  does not know if he has  high cholesterol, has high blood pressure and she does not have diabetes. Patient states that she has history of gestational hypertension, has history of pre-eclampsia/eclampsia and she has history of gestational diabetes.     Medications: Patient states that she does not take medication to lower cholesterol, blood pressure and blood sugar.  Patient does not take an aspirin a day to help prevent a heart attack or stroke.    Blood pressure, self measurement: Patient states that she does not measure blood pressure from home. She checks her blood pressure N/A. She shares her readings with a health care provider: N/A.   Nutrition: Patient states that on average she eats 2 cups of fruit and 1 cups of vegetables per day. Patient states that she does not eat fish at least 2 times per week. Patient eats less than half servings of whole grains. Patient drinks less than 36 ounces of beverages with added sugar weekly: yes. Patient is currently watching sodium or salt intake: yes. In the past 7 days patient has had 0 drinks containing alcohol. On average patient drinks 0 drinks containing alcohol per day.      Physical activity: Patient states that she gets 0 minutes of moderate and 0 minutes of vigorous physical activity each week.  Smoking status: Patient states that she has has never smoked .   Quality of life: Over the past 2 weeks patient states that she had little interest or pleasure in doing things: not at all. She has been feeling down, depressed or hopeless:not at all.   Social Determinants of Health Assessment:   Computer Use: During the last 12 months patient states that she has used any of the following: desktop/laptop, smart phone or tablet/other portable wireless  computer: yes.   Internet Use: During the last 12 months, did you or any member of your household have access to the internet: Yes, by paying a cell phone company or internet service provider.   Food Insecurities: During the last 12 months, where there any times when you were worried that you would run out of food because of a lack of money or other resources: Yes.   Transportation Barriers: During the last 12 months, have you missed a doctor's appointment because of transportation problems: No.   Childcare Barriers: If you are currently using childcare services, please identify  the type of services you use. (If not using childcare services, please select "Not applicable"): not applicable. During the last 12 months, have you had any barriers to childcare services such as: not applicable.   Housing: What is your housing situation today: I have housing.   Intimate Partner Violence: During the last 12 months, how often did your partner physically hurt you:  no partner . During the last 12 months, how often did your partner insult you or talk down to you:  no partner .  Medication Adherence: During the last 12 months, did you ever forget to take your medicine: not applicable. During the last 12 months, were you careless ar times about taking your medicine: not applicable. During the last 12 months, when you felt better did you sometimes stop taking your medication: not applicable. During the last 12 months, sometimes if you felt worse when you  took your medicine did you stop taking it: not applicable.    Risk reduction and counseling:   Health Coaching: Spoke with patient about the daily recommendations for fruits and vegetables. Showed patient what a serving size would look like. Encouraged patient to try and increase daily vegetable intake. Patient consumes some whole grains but not regularly. Patient has not been exercising recently due to the colder weather. Encouraged patient to try and get out  and move her body as the weather starts to get warmer.   Navigation: This was the  follow up session for this patient, I will check up on her progress in the coming months. Will refer patient to Internal Medicine for FU for elevated blood pressure. Provided patient a food bag from the [employer].  Time: 20 minutes

## 2023-03-16 ENCOUNTER — Ambulatory Visit: Payer: Self-pay | Admitting: Student

## 2023-03-16 VITALS — BP 130/60 | HR 94 | Temp 98.1°F | Ht 64.0 in | Wt 173.8 lb

## 2023-03-16 DIAGNOSIS — R7303 Prediabetes: Secondary | ICD-10-CM

## 2023-03-16 DIAGNOSIS — I10 Essential (primary) hypertension: Secondary | ICD-10-CM

## 2023-03-16 DIAGNOSIS — Z0130 Encounter for examination of blood pressure without abnormal findings: Secondary | ICD-10-CM | POA: Insufficient documentation

## 2023-03-16 DIAGNOSIS — E785 Hyperlipidemia, unspecified: Secondary | ICD-10-CM

## 2023-03-16 DIAGNOSIS — E782 Mixed hyperlipidemia: Secondary | ICD-10-CM

## 2023-03-16 NOTE — Assessment & Plan Note (Signed)
 A1c of 6.0 in the past, most recently in 2022.  She is working on lifestyle modification and has had some intentional weight loss.  Congratulated her on this.  Endorses fatigue, but this seems to be ongoing and due to her job.  Otherwise she denies frequency, urgency, nausea, vomiting, chest pain, shortness of breath. Plan: Repeat A1c today

## 2023-03-16 NOTE — Assessment & Plan Note (Signed)
 The 10-year ASCVD risk score (Arnett DK, et al., 2019) is: 1%   Values used to calculate the score:     Age: 43 years     Sex: Female     Is Non-Hispanic African American: No     Diabetic: No     Tobacco smoker: No     Systolic Blood Pressure: 130 mmHg     Is BP treated: No     HDL Cholesterol: 47 mg/dL     Total Cholesterol: 202 mg/dL  Minimal ASCVD risk, LDL slightly elevated in the past.  Would not recommend statin initiation at this time, but will recheck lipid panel today.

## 2023-03-16 NOTE — Progress Notes (Signed)
 Subjective:  CC: Wisewoman program, referral for high blood pressure  HPI:  [name] is a 43 y.o. person with a past medical history stated below and presents today for the stated chief complaint. Please see problem based assessment and plan for additional details.  Past Medical History:  Diagnosis Date   Anxiety    Depression    Foot fracture, left    as child   Gestational diabetes    History of eclampsia 02/17/2016   Base line PIH labs ordered today 02/17/2016: neg Taking ASA   Hypertension    Migraines    Pregnancy induced hypertension    Seizure disorder during pregnancy (HCC) 2012    Current Outpatient Medications on File Prior to Visit  Medication Sig Dispense Refill   acetaminophen (TYLENOL) 325 MG tablet Take 650 mg by mouth every 6 (six) hours as needed for mild pain. (Patient not taking: Reported on 03/01/2023)     [DISCONTINUED] omeprazole (PRILOSEC) 40 MG capsule Take 1 capsule (40 mg total) by mouth daily. 30 capsule 1   [DISCONTINUED] sertraline (ZOLOFT) 50 MG tablet Take 1 tablet (50 mg total) by mouth daily. 30 tablet 2   No current facility-administered medications on file prior to visit.    Review of Systems: Please see assessment and plan for pertinent positives and negatives.  Objective:   Vitals:   03/16/23 1559  BP: 130/60  Pulse: 94  Temp: 98.1 F (36.7 C)  TempSrc: Oral  SpO2: 99%  Weight: 173 lb 12.8 oz (78.8 kg)  Height: 5\' 4"  (1.626 m)    Physical Exam: Constitutional: Well-appearing Cardiovascular: Regular rate and rhythm Pulmonary/Chest: lungs clear to auscultation bilaterally Abdominal: soft, non-tender, non-distended Extremities: No edema of the lower extremities bilaterally Psych: Pleasant affect Thought process is linear and is goal-directed.     Assessment & Plan:  Hyperlipidemia The 10-year ASCVD risk score (Arnett DK, et al., 2019) is: 1%   Values used to calculate the score:     Age: 43 years      Sex: Female     Is Non-Hispanic African American: No     Diabetic: No     Tobacco smoker: No     Systolic Blood Pressure: 130 mmHg     Is BP treated: No     HDL Cholesterol: 47 mg/dL     Total Cholesterol: 202 mg/dL  Minimal ASCVD risk, LDL slightly elevated in the past.  Would not recommend statin initiation at this time, but will recheck lipid panel today.  Prediabetes A1c of 6.0 in the past, most recently in 2022.  She is working on lifestyle modification and has had some intentional weight loss.  Congratulated her on this.  Endorses fatigue, but this seems to be ongoing and due to her job.  Otherwise she denies frequency, urgency, nausea, vomiting, chest pain, shortness of breath. Plan: Repeat A1c today   Blood pressure check Patient was referred to [place] for hypertension.  I see documented pressure of 142/98.  At today's visit, her blood pressure is within normal limits at 130/60.  She states she does have a blood pressure cuff at home, but is not regularly taking her pressure there. Plan: Home blood pressures, will will message [place] if they are elevated Lifestyle modification and diet information provided Would not diagnose her with hypertension given single isolated elevated blood pressure    Patient discussed with Dr. Julian Reil MD [facility] Internal Medicine  PGY-1 Pager: [phone]  Phone:  [phone] Date 03/16/2023  Time 5:10 PM

## 2023-03-16 NOTE — Patient Instructions (Addendum)
 Thank you, [name] for allowing [place] to provide your care today.  I have ordered the following tests for you:  Lab Orders         Hemoglobin A1c         Lipid Profile        Follow up:  As needed     We look forward to seeing you next time. Please call our clinic at [phone] if you have any questions or concerns. The best time to call is Monday-Friday from 9am-4pm, but there is someone available 24/7. If after hours or the weekend, call the main hospital number and ask for the Internal Medicine Resident On-Call. If you need medication refills, please notify your pharmacy one week in advance and they will send [place] a request.   Thank you for trusting me with your care. Wishing you the best!  [name] MD [facility]

## 2023-03-16 NOTE — Assessment & Plan Note (Signed)
 Patient was referred to [place] for hypertension.  I see documented pressure of 142/98.  At today's visit, her blood pressure is within normal limits at 130/60.  She states she does have a blood pressure cuff at home, but is not regularly taking her pressure there. Plan: Home blood pressures, will will message [place] if they are elevated Lifestyle modification and diet information provided Would not diagnose her with hypertension given single isolated elevated blood pressure

## 2023-03-17 LAB — LIPID PANEL
Chol/HDL Ratio: 3.7 ratio (ref 0.0–4.4)
Cholesterol, Total: 174 mg/dL (ref 100–199)
HDL: 47 mg/dL (ref >39)
LDL Chol Calc (NIH): 94 mg/dL (ref 0–99)
Triglycerides: 193 mg/dL — ABNORMAL HIGH (ref 0–149)
VLDL Cholesterol Cal: 33 mg/dL (ref 5–40)

## 2023-03-17 LAB — HEMOGLOBIN A1C
Est. average glucose Bld gHb Est-mCnc: 114 mg/dL
Hgb A1c MFr Bld: 5.6 % (ref 4.8–5.6)

## 2023-03-24 NOTE — Progress Notes (Signed)
 Internal Medicine Clinic Attending  Case discussed with the resident at the time of the visit.  We reviewed the resident's history and exam and pertinent patient test results.  I agree with the assessment, diagnosis, and plan of care documented in the resident's note.

## 2023-04-08 ENCOUNTER — Ambulatory Visit
Admission: RE | Admit: 2023-04-08 | Discharge: 2023-04-08 | Disposition: A | Discharge status: Home / Self Care | Payer: No Typology Code available for payment source | Source: Ambulatory Visit | Attending: Obstetrics and Gynecology | Admitting: Obstetrics and Gynecology

## 2023-04-08 ENCOUNTER — Ambulatory Visit: Payer: Self-pay | Admitting: Hematology and Oncology

## 2023-04-08 VITALS — BP 123/56 | Wt 169.9 lb

## 2023-04-08 DIAGNOSIS — Z1211 Encounter for screening for malignant neoplasm of colon: Secondary | ICD-10-CM

## 2023-04-08 DIAGNOSIS — Z1231 Encounter for screening mammogram for malignant neoplasm of breast: Secondary | ICD-10-CM

## 2023-04-08 NOTE — Patient Instructions (Signed)
 Taught Laurelyn Sickle about self breast awareness and gave educational materials to take home. Patient did not need a Pap smear today due to last Pap smear was in 02/21/2019 per patient. Told patient about free cervical cancer screenings to receive a Pap smear if would like one next year. Let her know [facility] will cover Pap smears every 5 years unless has a history of abnormal Pap smears. Referred patient to the [facility] for screening mammogram. Appointment scheduled for 04/08/2023. Patient aware of appointment and will be there. Let patient know will follow up with her within the next couple weeks with results. Laurelyn Sickle verbalized understanding.  Pascal Lux, NP 2:43 PM

## 2023-04-08 NOTE — Progress Notes (Signed)
 [name] Hull is a 43 y.o. female who presents to [facility] clinic today with no complaints.    Pap Smear: Pap not smear completed today. Last Pap smear was 02/21/2019 at  and was normal. Per patient has no history of an abnormal Pap smear. Last Pap smear result is available in Epic.   Physical exam: Breasts Breasts symmetrical. No skin abnormalities bilateral breasts. No nipple retraction bilateral breasts. No nipple discharge bilateral breasts. No lymphadenopathy. No lumps palpated bilateral breasts.       Pelvic/Bimanual Pap is not indicated today    Smoking History: Patient has never smoked and was not referred to quit line.    Patient Navigation: Patient education provided. Access to services provided for patient through [facility] program. Natale Lay interpreter provided. No transportation provided   Colorectal Cancer Screening: Per patient has never had colonoscopy completed No complaints today. FIT test given.    Breast and Cervical Cancer Risk Assessment: Patient does not have family history of breast cancer, known genetic mutations, or radiation treatment to the chest before age 71. Patient does not have history of cervical dysplasia, immunocompromised, or DES exposure in-utero.  Risk Scores as of Encounter on 04/08/2023     [name]           5-year 0.8%   Lifetime 9.44%   This patient is Hisp[place]/Latina but has no documented birth country, so the [place] model used data from [place] patients to calculate their risk score. Document a birth country in the Demographics activity for a more accurate score.         Last calculated by Meryl Dare, CMA on 04/08/2023 at  2:24 PM        A: [facility] exam without pap smear No complaints with benign exam.   P: Referred patient to the [facility] for a screening mammogram. Appointment scheduled 04/08/2023.  Pascal Lux, NP 04/08/2023 2:40 PM

## 2023-04-16 LAB — FECAL OCCULT BLOOD, IMMUNOCHEMICAL: Fecal Occult Bld: NEGATIVE

## 2023-06-18 NOTE — Progress Notes (Deleted)
 Wisewoman Re-screening   Interpreter- Herma Longest, [place]   Clinical Measurement:  Vitals:   06/21/23 1005 06/21/23 1022  BP: 121/76 124/70   Fasting Labs Drawn Today, will review with patient when they result.   Medical History: Patient states that she has high cholesterol, does not have high blood pressure and she does not have diabetes. Patient states that she does not have history of gestational hypertension, does not have history of pre-eclampsia/eclampsia and she does not have history of gestational diabetes.    Medications: Patient states that she does not take medication to lower cholesterol, blood pressure or blood sugar.  Patient does not take an aspirin a day to help prevent a heart attack or stroke.    Blood pressure, self measurement: Patient states that she does measure blood pressure from home. She checks her blood pressure monthly. She shares her readings with a health care provider: no.   Nutrition: Patient states that on average she eats 2 cups of fruit and 2 cups of vegetables per day. Patient states that she does not eat fish at least 2 times per week. Patient eats less than half servings of whole grains. Patient drinks less than 36 ounces of beverages with added sugar weekly: yes. Patient is currently watching sodium or salt intake: yes. In the past 7 days patient has consumed drinks containing alcohol on 0 days. On a day that patient consumes drinks containing alcohol on average 0 drinks are consumed.      Physical activity: Patient states that she gets 140 minutes of moderate and 0 minutes of vigorous physical activity each week.  Smoking status: Patient states that she has has never smoked .   Quality of life: Over the past 2 weeks patient states that she had little interest or pleasure in doing things: several days. She has been feeling down, depressed or hopeless:several days.   Social Determinants of Health Assessment:   Computer Use: During the last 12 months  patient states that she has used any of the following: desktop/laptop, smart phone or tablet/other portable wireless computer: yes.   Internet Use: During the last 12 months, did you or any member of your household have access to the internet: Yes, by paying a cell phone company or internet service provider.   Food Insecurities: During the last 12 months, where there any times when you were worried that you would run out of food because of a lack of money or other resources: No.   Transportation Barriers: During the last 12 months, have you missed a doctor's appointment because of transportation problems: No.   Childcare Barriers: If you are currently using childcare services, please identify  the type of services you use. (If not using childcare services, please select Not applicable): not applicable. During the last 12 months, have you had any barriers to childcare services such as: not applicable.   Housing: What is your housing situation today: I have housing.   Intimate Partner Violence: During the last 12 months, how often did your partner physically hurt you: never. During the last 12 months, how often did your partner insult you or talk down to you: never.  Medication Adherence: During the last 12 months, did you ever forget to take your medicine: not applicable. During the last 12 months, were you careless ar times about taking your medicine: not applicable. During the last 12 months, when you felt better did you sometimes stop taking your medication: not applicable. During the last 12 months, sometimes if  you felt worse when you took your medicine did you stop taking it: not applicable.   Risk reduction and counseling:   Health Coaching:Spoke with patient about practicing a well balanced diet. Patient has been doing intermittent fasting. Patient states that she has been feeling more tired and having less energy recently as well as dizziness. Encouraged patient to try and start eating 3  meals a day instead of one large meal to see if she feels any better. Let her know that it may take some time before she notices any changes. Patient has been walking daily and has recently joined a gym. Patient has not been drinking enough water recently. Encouraged patient to try and drink at least 64 ounces of water daily. Gave patient a 32 ounce water bottle to take home. Gave the suggestion of filling the water bottle up in the morning when she wakes up and try to drink the whole bottle before lunch time and then fill the bottle up again to drink in the PM.  Goal: Patient will increase daily water intake. Patient will try and consume 64 ounces of water daily. Patient will work on reaching this goal over the next month.   Navigation:  I will notify patient of lab results.  Patient is aware of 2 more health coaching sessions and a follow up. Will refer patient to [employer] program for counseling services.

## 2023-06-21 ENCOUNTER — Other Ambulatory Visit

## 2023-06-21 ENCOUNTER — Ambulatory Visit

## 2023-09-14 ENCOUNTER — Other Ambulatory Visit: Payer: Self-pay

## 2023-09-14 DIAGNOSIS — N6310 Unspecified lump in the right breast, unspecified quadrant: Secondary | ICD-10-CM

## 2023-10-07 ENCOUNTER — Ambulatory Visit

## 2023-10-07 ENCOUNTER — Ambulatory Visit: Payer: Self-pay | Admitting: *Deleted

## 2023-10-07 VITALS — BP 124/62 | Wt 166.0 lb

## 2023-10-07 DIAGNOSIS — Z1239 Encounter for other screening for malignant neoplasm of breast: Secondary | ICD-10-CM

## 2023-10-07 DIAGNOSIS — N6311 Unspecified lump in the right breast, upper outer quadrant: Secondary | ICD-10-CM

## 2023-10-07 NOTE — Patient Instructions (Signed)
 Explained breast self awareness with Flannery Zavala Hernandez. Patient did not need a Pap smear today due to last Pap smear and HPV typing was 02/21/2019. Let her know [facility] will cover Pap smears and HPV typing every 5 years unless has a history of abnormal Pap smears. Referred patient to the [facility] for a diagnostic mammogram. Appointment scheduled Thursday, October 07, 2023 at 0940. Patient aware of appointment and will be there. Doyal Helane Nap verbalized understanding.  Kewanda Poland, Wanda Ship, RN 8:46 AM

## 2023-10-07 NOTE — Progress Notes (Signed)
 [name] is a 43 y.o. female who presents to  [facility] today with complaint of right breast lump x 3 weeks that has decreased in size.    Pap Smear: Pap smear not completed today. Last Pap smear was 02/21/2019 at [facility] and was normal with negative HPV. Per patient has history of an abnormal Pap smear in 2009 that a repeat Pap smear was completed for follow-up. Per patient all Pap smears have been normal and has had at least three normal Pap smears since. Last Pap smear result is available in Epic.    Physical exam: Breasts Breasts symmetrical. No skin abnormalities bilateral breasts. No nipple retraction bilateral breasts. No nipple discharge bilateral breasts. No lymphadenopathy. No lumps palpated left breast. Palpated a pea sized lump within the right breast at 10 o'clock 10 cm from the nipple. No complaints of pain or tenderness on exam.  MS 3D SCR MAMMO BILAT BR (aka MM) Result Date: 04/13/2023 CLINICAL DATA:  Screening. EXAM: DIGITAL SCREENING BILATERAL MAMMOGRAM WITH TOMOSYNTHESIS AND CAD TECHNIQUE: Bilateral screening digital craniocaudal and mediolateral oblique mammograms were obtained. Bilateral screening digital breast tomosynthesis was performed. The images were evaluated with computer-aided detection. COMPARISON:  Previous exam(s). ACR Breast Density Category b: There are scattered areas of fibroglandular density. FINDINGS: There are no findings suspicious for malignancy. IMPRESSION: No mammographic evidence of malignancy. A result letter of this screening mammogram will be mailed directly to the patient. RECOMMENDATION: Screening mammogram in one year. (Code:SM-B-01Y) BI-RADS CATEGORY  1: Negative. Electronically Signed   By: Toribio Agreste M.D.   On: 04/13/2023 12:41   MS DIGITAL SCREENING TOMO BILATERAL Result Date: 02/27/2022 CLINICAL DATA:  Screening. EXAM: DIGITAL SCREENING BILATERAL MAMMOGRAM WITH TOMOSYNTHESIS AND CAD TECHNIQUE: Bilateral screening digital  craniocaudal and mediolateral oblique mammograms were obtained. Bilateral screening digital breast tomosynthesis was performed. The images were evaluated with computer-aided detection. COMPARISON:  Previous exam(s). ACR Breast Density Category b: There are scattered areas of fibroglandular density. FINDINGS: There are no findings suspicious for malignancy. IMPRESSION: No mammographic evidence of malignancy. A result letter of this screening mammogram will be mailed directly to the patient. RECOMMENDATION: Screening mammogram in one year. (Code:SM-B-01Y) BI-RADS CATEGORY  1: Negative. Electronically Signed   By: Inocente Ast M.D.   On: 02/27/2022 12:25   MS DIGITAL SCREENING TOMO BILATERAL Result Date: 04/12/2020 CLINICAL DATA:  Screening. EXAM: DIGITAL SCREENING BILATERAL MAMMOGRAM WITH TOMOSYNTHESIS AND CAD TECHNIQUE: Bilateral screening digital craniocaudal and mediolateral oblique mammograms were obtained. Bilateral screening digital breast tomosynthesis was performed. The images were evaluated with computer-aided detection. COMPARISON:  Previous exam(s). ACR Breast Density Category b: There are scattered areas of fibroglandular density. FINDINGS: There are no findings suspicious for malignancy. The images were evaluated with computer-aided detection. IMPRESSION: No mammographic evidence of malignancy. A result letter of this screening mammogram will be mailed directly to the patient. RECOMMENDATION: Screening mammogram in one year. (Code:SM-B-01Y) BI-RADS CATEGORY  1: Negative. Electronically Signed   By: Elspeth Bathe M.D.   On: 04/12/2020 15:12   MS DIGITAL DIAG TOMO BILAT Result Date: 02/21/2019 CLINICAL DATA:  43 year old presenting with RIGHT nipple pain which began approximately 2-3 weeks ago, which the patient states has recently resolved. This is the patient's initial baseline mammogram. EXAM: DIGITAL DIAGNOSTIC BILATERAL MAMMOGRAM WITH CAD AND TOMO ULTRASOUND RIGHT BREAST COMPARISON:  None. ACR  Breast Density Category b: There are scattered areas of fibroglandular density. FINDINGS: Tomosynthesis and synthesized full field CC and MLO views of both breasts were obtained.  Tomosynthesis and synthesized spot compression view of the subareolar location of the RIGHT breast was also obtained. No findings suspicious for malignancy in either breast. Specifically, no mammographic abnormality in the subareolar RIGHT breast to explain the patient's prior nipple pain. Mammographic images were processed with CAD. Targeted RIGHT breast ultrasound is performed, showing normal fibroglandular tissue in the subareolar location. No cyst, solid mass, duct ectasia or intraductal mass is identified. IMPRESSION: 1. No mammographic or sonographic evidence of malignancy involving the RIGHT breast. 2. No mammographic evidence of malignancy involving the LEFT breast. RECOMMENDATION: Screening mammogram in one year.(Code:SM-B-01Y) I have discussed the findings and recommendations with the patient. Communication with the patient was achieved with the assistance of a certified interpreter. If applicable, a reminder letter will be sent to the patient regarding the next appointment. BI-RADS CATEGORY  1: Negative. Electronically Signed   By: Debby Satterfield M.D.   On: 02/21/2019 14:20        Pelvic/Bimanual Pap is not indicated today per BCCCP guidelines.   Smoking History: Patient has never smoked.   Patient Navigation: Patient education provided. Access to services provided for patient through [place] program. Spanish interpreter Bernice Angry from [facility] provided.   Colorectal Cancer Screening: Per patient has never had colonoscopy completed. FIT Test completed 04/12/2023 that was negative. No complaints today.    Breast and Cervical Cancer Risk Assessment: Patient has family history of a paternal 1st cousin having breast cancer. Patient has no known genetic mutations or history of radiation treatment to the chest before  age 73. Patient does not have history of cervical dysplasia, immunocompromised, or DES exposure in-utero.  Risk Scores as of Encounter on 10/07/2023     [place]           5-year 0.8%   Lifetime 9.44%   This patient is [place]/Lat[place] but has no documented birth country, so the [place] model used data from [place] patients to calculate their risk score. Document a birth country in the Demographics activity for a more accurate score.         Last calculated by Silas, Ansyi K, CMA on 10/07/2023 at  8:32 AM        A: BCCCP exam without pap smear Complaint of right breat lump.  P: Referred patient to the [facility] for a diagnostic mammogram. Appointment scheduled Thursday, October 07, 2023 at 0940.  Driscilla Wanda SQUIBB, RN 10/07/2023 8:45 AM

## 2023-10-12 ENCOUNTER — Other Ambulatory Visit: Payer: Self-pay | Admitting: Obstetrics and Gynecology

## 2023-10-12 ENCOUNTER — Ambulatory Visit
Admission: RE | Admit: 2023-10-12 | Discharge: 2023-10-12 | Disposition: A | Discharge status: Home / Self Care | Source: Ambulatory Visit | Attending: Obstetrics and Gynecology | Admitting: Obstetrics and Gynecology

## 2023-10-12 ENCOUNTER — Ambulatory Visit
Admission: RE | Admit: 2023-10-12 | Discharge: 2023-10-12 | Disposition: A | Source: Ambulatory Visit | Attending: Obstetrics and Gynecology | Admitting: Obstetrics and Gynecology

## 2023-10-12 DIAGNOSIS — N6310 Unspecified lump in the right breast, unspecified quadrant: Secondary | ICD-10-CM

## 2024-05-16 ENCOUNTER — Ambulatory Visit: Payer: Self-pay | Admitting: Student
